# Patient Record
Sex: Female | Born: 1950
Health system: Southern US, Community
[De-identification: ages and names within clinical notes are randomized; demographics above are authoritative.]

## PROBLEM LIST (undated history)

## (undated) DIAGNOSIS — I10 Essential (primary) hypertension: Secondary | ICD-10-CM

## (undated) DIAGNOSIS — J449 Chronic obstructive pulmonary disease, unspecified: Secondary | ICD-10-CM

## (undated) DIAGNOSIS — I739 Peripheral vascular disease, unspecified: Secondary | ICD-10-CM

## (undated) DIAGNOSIS — M1990 Unspecified osteoarthritis, unspecified site: Secondary | ICD-10-CM

## (undated) DIAGNOSIS — E7800 Pure hypercholesterolemia, unspecified: Secondary | ICD-10-CM

## (undated) HISTORY — PX: TUBAL LIGATION: SHX77

---
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
## 2006-05-03 ENCOUNTER — Encounter: Payer: Self-pay | Admitting: Family Medicine

## 2006-05-03 LAB — CONVERTED CEMR LAB
Cholesterol: 170 mg/dL
HDL: 61 mg/dL
Pap Smear: NORMAL

## 2006-06-19 ENCOUNTER — Ambulatory Visit: Payer: Self-pay | Admitting: Internal Medicine

## 2006-09-25 ENCOUNTER — Ambulatory Visit: Payer: Self-pay | Admitting: Family Medicine

## 2006-09-25 DIAGNOSIS — I10 Essential (primary) hypertension: Secondary | ICD-10-CM | POA: Insufficient documentation

## 2006-09-25 DIAGNOSIS — M810 Age-related osteoporosis without current pathological fracture: Secondary | ICD-10-CM | POA: Insufficient documentation

## 2007-01-02 ENCOUNTER — Telehealth: Payer: Self-pay | Admitting: Family Medicine

## 2007-02-07 ENCOUNTER — Ambulatory Visit: Payer: Self-pay | Admitting: Family Medicine

## 2007-02-07 DIAGNOSIS — F17200 Nicotine dependence, unspecified, uncomplicated: Secondary | ICD-10-CM | POA: Insufficient documentation

## 2007-02-10 ENCOUNTER — Encounter: Payer: Self-pay | Admitting: Family Medicine

## 2007-02-20 ENCOUNTER — Telehealth: Payer: Self-pay | Admitting: Family Medicine

## 2007-06-02 ENCOUNTER — Ambulatory Visit: Payer: Self-pay | Admitting: Family Medicine

## 2007-06-18 ENCOUNTER — Telehealth: Payer: Self-pay | Admitting: Family Medicine

## 2007-11-27 ENCOUNTER — Ambulatory Visit: Payer: Self-pay | Admitting: Family Medicine

## 2007-11-27 DIAGNOSIS — M19049 Primary osteoarthritis, unspecified hand: Secondary | ICD-10-CM | POA: Insufficient documentation

## 2008-01-12 ENCOUNTER — Encounter: Payer: Self-pay | Admitting: Family Medicine

## 2008-01-13 LAB — CONVERTED CEMR LAB
ALT: 13 units/L (ref 0–35)
AST: 12 units/L (ref 0–37)
Albumin: 3.9 g/dL (ref 3.5–5.2)
BUN: 9 mg/dL (ref 6–23)
CO2: 26 meq/L (ref 19–32)
Cholesterol: 155 mg/dL (ref 0–200)
Creatinine, Ser: 0.67 mg/dL (ref 0.40–1.20)
Glucose, Bld: 93 mg/dL (ref 70–99)
LDL Cholesterol: 79 mg/dL (ref 0–99)
Sodium: 139 meq/L (ref 135–145)
Total CHOL/HDL Ratio: 2.5
VLDL: 13 mg/dL (ref 0–40)

## 2008-01-27 ENCOUNTER — Ambulatory Visit: Payer: Self-pay | Admitting: Family Medicine

## 2008-03-01 ENCOUNTER — Telehealth: Payer: Self-pay | Admitting: Family Medicine

## 2009-01-10 ENCOUNTER — Encounter: Payer: Self-pay | Admitting: Family Medicine

## 2009-01-10 LAB — HM MAMMOGRAPHY: HM Mammogram: NORMAL

## 2009-01-12 ENCOUNTER — Telehealth: Payer: Self-pay | Admitting: Family Medicine

## 2009-04-06 ENCOUNTER — Telehealth: Payer: Self-pay | Admitting: Family Medicine

## 2010-01-30 ENCOUNTER — Telehealth: Payer: Self-pay | Admitting: Family Medicine

## 2010-01-31 ENCOUNTER — Ambulatory Visit: Payer: Self-pay | Admitting: Family Medicine

## 2010-01-31 DIAGNOSIS — L219 Seborrheic dermatitis, unspecified: Secondary | ICD-10-CM | POA: Insufficient documentation

## 2010-02-08 ENCOUNTER — Telehealth: Payer: Self-pay | Admitting: Family Medicine

## 2010-02-14 ENCOUNTER — Encounter: Payer: Self-pay | Admitting: Family Medicine

## 2010-02-15 LAB — CONVERTED CEMR LAB
Albumin: 3.7 g/dL (ref 3.5–5.2)
Alkaline Phosphatase: 108 units/L (ref 39–117)
BUN: 7 mg/dL (ref 6–23)
Cholesterol: 137 mg/dL (ref 0–200)
Glucose, Bld: 94 mg/dL (ref 70–99)
LDL Cholesterol: 58 mg/dL (ref 0–99)
Potassium: 3.9 meq/L (ref 3.5–5.3)
Total Bilirubin: 0.8 mg/dL (ref 0.3–1.2)

## 2010-05-18 ENCOUNTER — Ambulatory Visit: Payer: Self-pay | Admitting: Family

## 2010-05-18 DIAGNOSIS — R609 Edema, unspecified: Secondary | ICD-10-CM | POA: Insufficient documentation

## 2010-05-19 ENCOUNTER — Encounter: Admission: RE | Admit: 2010-05-19 | Discharge: 2010-05-19 | Payer: Self-pay | Admitting: Family Medicine

## 2010-07-05 ENCOUNTER — Other Ambulatory Visit: Admission: RE | Admit: 2010-07-05 | Discharge: 2010-07-05 | Payer: Self-pay | Admitting: Family Medicine

## 2010-07-05 ENCOUNTER — Ambulatory Visit: Payer: Self-pay | Admitting: Family Medicine

## 2010-07-05 DIAGNOSIS — R0789 Other chest pain: Secondary | ICD-10-CM | POA: Insufficient documentation

## 2010-07-05 LAB — HM PAP SMEAR

## 2010-07-14 ENCOUNTER — Encounter: Payer: Self-pay | Admitting: Family Medicine

## 2010-07-15 LAB — CONVERTED CEMR LAB
ALT: 14 units/L (ref 0–35)
AST: 16 units/L (ref 0–37)
BUN: 6 mg/dL (ref 6–23)
CO2: 28 meq/L (ref 19–32)
Calcium: 9.5 mg/dL (ref 8.4–10.5)
Chloride: 100 meq/L (ref 96–112)
Cholesterol: 188 mg/dL (ref 0–200)
Glucose, Bld: 97 mg/dL (ref 70–99)
HCT: 42.9 % (ref 36.0–46.0)
HDL: 58 mg/dL (ref >39)
Hemoglobin: 13.7 g/dL (ref 12.0–15.0)
LDL Cholesterol: 110 mg/dL — ABNORMAL HIGH (ref 0–99)
Potassium: 4.5 meq/L (ref 3.5–5.3)
RBC: 4.15 M/uL (ref 3.87–5.11)
TSH: 3.416 microintl units/mL (ref 0.350–4.500)
Total Protein: 6.5 g/dL (ref 6.0–8.3)
Vit D, 25-Hydroxy: 22 ng/mL — ABNORMAL LOW (ref 30–89)
WBC: 7.2 10*3/uL (ref 4.0–10.5)

## 2010-07-17 ENCOUNTER — Encounter: Payer: Self-pay | Admitting: Family Medicine

## 2010-12-05 NOTE — Progress Notes (Signed)
Summary: Wants brand name Hyzaar  Phone Note Call from Patient Call back at Work Phone [phone]   Caller: Patient Call For: Brenda Gasser MD Summary of Call: Pt would like the brand name med sent to her pharmacy- tried generic in past and states this is what it does. Please send to [facility] on [address] in [place]- [phone] Initial call taken by: Kathlene November,  February 08, 2010 12:29 PM  Follow-up for Phone Call        OK. SEnt.  Follow-up by: Brenda Gasser MD,  February 08, 2010 12:38 PM    Prescriptions: Mauri Reading 50-12.5 MG TABS (LOSARTAN POTASSIUM-HCTZ) Take 1 tablet by mouth once a day Brand medically necessary #90.0 Each x 1   Entered and Authorized by:   Brenda Gasser MD   Signed by:   Brenda Gasser MD on 02/08/2010   Method used:   Electronically to        [facility]  [phone]* (retail)       [address]       [place], [place]  [address]       Ph: [phone]       Fax: [phone]   RxID:   [id number]

## 2010-12-05 NOTE — Progress Notes (Signed)
Summary: BP med  Phone Note Call from Patient Call back at Work Phone [phone]   Caller: Patient Call For: Nani Gasser MD Summary of Call: Pt calls and took last BP med this morning and her refill was denied because she hasn't been seen since 2009. States is going out of town tomorrow and needs them. Please advise what you want me to do Initial call taken by: Kathlene November,  January 30, 2010 1:00 PM  Follow-up for Phone Call        Has had since December to make appt. Can go to Urgent care if needs a "quick refill"  Follow-up by: Nani Gasser MD,  January 30, 2010 4:08 PM

## 2010-12-05 NOTE — Assessment & Plan Note (Signed)
Summary: HTN, seborrhea   Vital Signs:  Patient profile:   60 year old female Height:      60 inches Weight:      181 pounds BMI:     35.48 Pulse rate:   90 / minute BP sitting:   155 / 79  (left arm) Cuff size:   regular  Vitals Entered By: Kathlene November (January 31, 2010 8:35 AM) CC: followup BP. Pt did not have BP med this morning- is out, Hypertension Management   Primary Care Provider:  Nani Gasser MD  CC:  followup BP. Pt did not have BP med this morning- is out and Hypertension Management.  History of Present Illness: followup BP. Pt did not have BP med this morning- is out.    Has a rash on her forehead that comes and goes every few years.  Usually responds well to tetracycline. Says topical steroids only irritate it. Told by dermatology in the past it is seborrheic dermatitis.   Hypertension History:      She denies headache, chest pain, palpitations, dyspnea with exertion, orthopnea, PND, peripheral edema, visual symptoms, neurologic problems, syncope, and side effects from treatment.  She notes no problems with any antihypertensive medication side effects.        Positive major cardiovascular risk factors include female age 41 years old or older, hypertension, and current tobacco user.     Current Medications (verified): 1)  Hyzaar 50-12.5 Mg Tabs (Losartan Potassium-Hctz) .... Take 1 Tablet By Mouth Once A Day 2)  Multivitamins  Tabs (Multiple Vitamin) .... Take 1 Tablet By Mouth Once A Day 3)  Calcarb 600/d 600-125 Mg-Unit Tabs (Calcium-Vitamin D) .... Take 1 Tablet By Mouth Once A Day 4)  Fish Oil 1000 Mg Caps (Omega-3 Fatty Acids) .... Take 1 Tablet By Mouth Once A Day 5)  Aspirin 81 Mg Tbec (Aspirin) .... Take 1 Tablet By Mouth Once A Day  Allergies (verified): No Known Drug Allergies  Comments:  Nurse/Medical Assistant: The patient's medications and allergies were reviewed with the patient and were updated in the Medication and Allergy Lists. Kathlene November (January 31, 2010 8:36 AM)  Physical Exam  General:  Well-developed,well-nourished,in no acute distress; alert,appropriate and cooperative throughout examination Head:  Normocephalic and atraumatic without obvious abnormalities. No apparent alopecia or balding. Lungs:  Normal respiratory effort, chest expands symmetrically. Lungs are clear to auscultation, no crackles or wheezes. Heart:  Normal rate and regular rhythm. S1 and S2 normal without gallop, murmur, click, rub or other extra  No carotid.   Skin:  Approx 1.5 cm round dry scaling red plaque on her forehead with about 3 pustules.  Psych:  Cognition and judgment appear intact. Alert and cooperative with normal attention span and concentration. No apparent delusions, illusions, hallucinations   Impression & Recommendations:  Problem # 1:  HYPERTENSION, BENIGN ESSENTIAL (ICD-401.1) Elevated today but ran out of her medicine yesterday. Due for labs. Says she will go on Monday.  REstart med and f/u in 6 months. Still is still smoking and not ready to quit.  Her updated medication list for this problem includes:    Hyzaar 50-12.5 Mg Tabs (Losartan potassium-hctz) .Marland Kitchen... Take 1 tablet by mouth once a day  BP today: 155/79 Prior BP: 127/68 (01/27/2008)  Prior 10 Yr Risk Heart Disease: Not enough information (09/25/2006)  Labs Reviewed: K+: 4.5 (01/12/2008) Creat: : 0.67 (01/12/2008)   Chol: 155 (01/12/2008)   HDL: 63 (01/12/2008)   LDL: 79 (01/12/2008)   TG: 65 (  01/12/2008)  Orders: T-Comprehensive Metabolic Panel [phone]) T-Lipid Profile ([id number])  Problem # 2:  DERMATITIS, SEBORRHEIC (ICD-690.10) Typically responds to tetracycline so will rx. If doesn't help them please let me know.   Problem # 3:  OSTEOPOROSIS NOS (ICD-733.00) Says the Boniva would make her sick for about 3 days. Nauseated, no vomiting so she would like to try somehting else. Will change her to alendronate. Call if any SE. Will check Vitamin D  level as well.   The following medications were removed from the medication list:    Boniva 150 Mg Tabs (Ibandronate sodium) ..... One by mouth monthly Her updated medication list for this problem includes:    Calcarb 600/d 600-125 Mg-unit Tabs (Calcium-vitamin d) .Marland Kitchen... Take 1 tablet by mouth once a day    Alendronate Sodium 70 Mg Tabs (Alendronate sodium) ..... One by mouth once a week.  Orders: T-Dual DXA Bone Density/ Axial (29562) T-Vitamin D (25-Hydroxy) ([id number])  Complete Medication List: 1)  Hyzaar 50-12.5 Mg Tabs (Losartan potassium-hctz) .... Take 1 tablet by mouth once a day 2)  Multivitamins Tabs (Multiple vitamin) .... Take 1 tablet by mouth once a day 3)  Calcarb 600/d 600-125 Mg-unit Tabs (Calcium-vitamin d) .... Take 1 tablet by mouth once a day 4)  Fish Oil 1000 Mg Caps (Omega-3 fatty acids) .... Take 1 tablet by mouth once a day 5)  Aspirin 81 Mg Tbec (Aspirin) .... Take 1 tablet by mouth once a day 6)  Tetracycline Hcl 500 Mg Caps (Tetracycline hcl) .... Take 1 tablet by mouth two times a day for 10 days 7)  Alendronate Sodium 70 Mg Tabs (Alendronate sodium) .... One by mouth once a week.  Other Orders: T-Mammography, Diagnostic (bilateral) ([id number])  Hypertension Assessment/Plan:      The patient's hypertensive risk group is category B: At least one risk factor (excluding diabetes) with no target organ damage.  Her calculated 10 year risk of coronary heart disease is 8 %.  Today's blood pressure is 155/79.  Her blood pressure goal is < 140/90.  Patient Instructions: 1)  For blood pressure follow up in 6 months.  2)  For leg swelling please come in sooner.   3)  Can call to schedule your bone density and your mammogram. 4)  We will schedule you for a colonoscopy as well.  Prescriptions: ALENDRONATE SODIUM 70 MG TABS (ALENDRONATE SODIUM) one by mouth once a week.  #4 x 11   Entered and Authorized by:   Nani Gasser MD   Signed by:   Nani Gasser MD on  01/31/2010   Method used:   Electronically to        [facility]* (retail)       [address] [place], [place]  [address]       Ph: [phone]       Fax: [phone]   RxID:   [id number] JOACZY 50-12.5 MG TABS (LOSARTAN POTASSIUM-HCTZ) Take 1 tablet by mouth once a day  #90.0 Each x 1   Entered and Authorized by:   Nani Gasser MD   Signed by:   Nani Gasser MD on 01/31/2010   Method used:   Electronically to        [facility]* (retail)       [address] [place], [place]  [address]       Ph: [phone]       Fax: [phone]  RxID:   [id number] TETRACYCLINE HCL 500 MG CAPS (TETRACYCLINE HCL) Take 1 tablet by mouth two times a day for 10 days  #20 x 0   Entered and Authorized by:   Nani Gasser MD   Signed by:   Nani Gasser MD on 01/31/2010   Method used:   Electronically to        [facility]* (retail)       [address] [place], [place]  [address]       Ph: [phone]       Fax: [phone]   RxID:   [id number]

## 2010-12-05 NOTE — Assessment & Plan Note (Signed)
Summary: Lt. ankle swollen-jr   Vital Signs:  Patient profile:   60 year old female Height:      60 inches Weight:      179 pounds Pulse rate:   76 / minute BP sitting:   146 / 73  (left arm) Cuff size:   regular  Vitals Entered By: Kathlene November (May 18, 2010 3:50 PM) CC: left foot and leg swelling- pain lateral aspect of left knee   Primary Care Provider:  Nani Gasser MD  CC:  left foot and leg swelling- pain lateral aspect of left knee.  History of Present Illness: Brenda Evans is a a 60 year old female who presents today with complaint of left foot swelling.  This has been going on for one month, though she notes that she did have one good week during this month when the swelling was not as severe.   She also notes some pain in her lateral knee.  She notes chronic SOB- though it is a baseline.  Denies fever.  Swelling is improved by elevation, worsened by sitting.  HTN-  notes that her BP at her office this AM was 136/99.  When she left work it was 156/99.    Current Medications (verified): 1)  Hyzaar 50-12.5 Mg Tabs (Losartan Potassium-Hctz) .... Take 1 Tablet By Mouth Once A Day 2)  Multivitamins  Tabs (Multiple Vitamin) .... Take 1 Tablet By Mouth Once A Day 3)  Calcarb 600/d 600-125 Mg-Unit Tabs (Calcium-Vitamin D) .... Take 1 Tablet By Mouth Once A Day 4)  Aspirin 81 Mg Tbec (Aspirin) .... Take 1 Tablet By Mouth Once A Day 5)  Vitamin D 400 Unit Tabs (Cholecalciferol) .... Take One Tablet By Mouth Once A Dya  Allergies (verified): No Known Drug Allergies  Comments:  Nurse/Medical Assistant: The patient's medications and allergies were reviewed with the patient and were updated in the Medication and Allergy Lists. Kathlene November (May 18, 2010 3:51 PM)  Review of Systems       see HPI  Physical Exam  General:  Well-developed,well-nourished,in no acute distress; alert,appropriate and cooperative throughout examination Lungs:  Normal respiratory effort, chest  expands symmetrically. Lungs are clear to auscultation, no crackles or wheezes. Heart:  Normal rate and regular rhythm. S1 and S2 normal without gallop, murmur, click, rub or other extra sounds. Extremities:  + varicose veins.  + spider veins, + swelling of LLE 2-3+ up to knee.   Impression & Recommendations:  Problem # 1:  EDEMA, LIMB (ICD-782.3) Assessment Deteriorated LLE duplex was performed to rule out DVT and was negative.  She is maintained on Hyzaar.  An additional 12.5 of HCTZ was added to her regimen to help with her uncontrolled BP and swelling.  Her updated medication list for this problem includes:    Hyzaar 50-12.5 Mg Tabs (Losartan potassium-hctz) .Marland Kitchen... Take 1 tablet by mouth once a day    Hydrochlorothiazide 12.5 Mg Caps (Hydrochlorothiazide) ..... One tablet by mouth daily  Orders: Ultrasound (Ultrasound)  Problem # 2:  HYPERTENSION, BENIGN ESSENTIAL (ICD-401.1) Assessment: Deteriorated see #1 Her updated medication list for this problem includes:    Hyzaar 50-12.5 Mg Tabs (Losartan potassium-hctz) .Marland Kitchen... Take 1 tablet by mouth once a day    Hydrochlorothiazide 12.5 Mg Caps (Hydrochlorothiazide) ..... One tablet by mouth daily  BP today: 146/73 Prior BP: 155/79 (01/31/2010)  Prior 10 Yr Risk Heart Disease: 8 % (01/31/2010)  Labs Reviewed: K+: 3.9 (02/14/2010) Creat: : 0.71 (02/14/2010)   Chol: 137 (02/14/2010)  HDL: 56 (02/14/2010)   LDL: 58 (02/14/2010)   TG: 114 (02/14/2010)  Complete Medication List: 1)  Hyzaar 50-12.5 Mg Tabs (Losartan potassium-hctz) .... Take 1 tablet by mouth once a day 2)  Multivitamins Tabs (Multiple vitamin) .... Take 1 tablet by mouth once a day 3)  Calcarb 600/d 600-125 Mg-unit Tabs (Calcium-vitamin d) .... Take 1 tablet by mouth once a day 4)  Aspirin 81 Mg Tbec (Aspirin) .... Take 1 tablet by mouth once a day 5)  Vitamin D 400 Unit Tabs (Cholecalciferol) .... Take one tablet by mouth once a dya 6)  Hydrochlorothiazide 12.5 Mg Caps  (Hydrochlorothiazide) .... One tablet by mouth daily  Patient Instructions: 1)  Please complete your ultrasound, we will call you with results. 2)  Please follow up in 1 month. Prescriptions: HYDROCHLOROTHIAZIDE 12.5 MG CAPS (HYDROCHLOROTHIAZIDE) one tablet by mouth daily  #30 x 0   Entered and Authorized by:   Lemont Fillers FNP   Signed by:   Lemont Fillers FNP on 05/18/2010   Method used:   Electronically to        [facility]* (retail)       [address] [place], [place]  [address]       Ph: [phone]       Fax: [phone]   RxID:   [id number] HYZAAR 50-12.5 MG TABS (LOSARTAN POTASSIUM-HCTZ) Take 1 tablet by mouth once a day Brand medically necessary #90.0 Each x 0   Entered and Authorized by:   Lemont Fillers FNP   Signed by:   Lemont Fillers FNP on 05/18/2010   Method used:   Electronically to        [facility]* (retail)       [address] [place], [place]  [address]       Ph: [phone]       Fax: [phone]   RxID:   [id number]

## 2010-12-05 NOTE — Assessment & Plan Note (Signed)
Summary: CPE WITH FASTING LABS   Vital Signs:  Patient profile:   60 year old female Height:      60 inches Weight:      176 pounds Pulse rate:   81 / minute BP sitting:   126 / 74  (right arm) Cuff size:   regular  Vitals Entered By: Kathlene November (July 05, 2010 8:02 AM) CC: CPE and Pap   Primary Care [place]:  Nani Gasser MD  CC:  CPE and Pap.  History of Present Illness: No regular exercise.  Some ankle swelling in the left leg for for about 3 months. Has pain from the knee up.    Last thursday had a chest pain in the center of her chest while sitting at her desk. Lasted about a minute.The a few minutes later got up form her desk and felt like the room was moving or shifting. Lasted about 5 minutes.  Then resolved.  No more chest pain since then.  No sweating or radiating of pain. Occ will feel her heart pound for  a few beats.  Current Medications (verified): 1)  Hyzaar 50-12.5 Mg Tabs (Losartan Potassium-Hctz) .... Take 1 Tablet By Mouth Once A Day 2)  Multivitamins  Tabs (Multiple Vitamin) .... Take 1 Tablet By Mouth Once A Day 3)  Aspirin 81 Mg Tbec (Aspirin) .... Take 1 Tablet By Mouth Once A Day 4)  Vitamin D 400 Unit Tabs (Cholecalciferol) .... Take One Tablet By Mouth Once A Dya  Allergies (verified): No Known Drug Allergies  Comments:  Nurse/Medical Assistant: The patient's medications and allergies were reviewed with the patient and were updated in the Medication and Allergy Lists. 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 Divorced.  ENjoys job.  RAised by grandmother.  Mother died in car accident when 6 months.  Never knew her father.   Current Smoker Alcohol  use-yes Drug use-no Regular exercise-no  Contraindications/Deferment of Procedures/Staging:    Test/Procedure: Colonoscopy    Reason for deferment: patient declined     Test/Procedure: FLU VAX    Reason for deferment: patient declined   Review of Systems       The patient complains of chest pain.  The patient denies anorexia, fever, weight loss, weight gain, vision loss, decreased hearing, hoarseness, syncope, dyspnea on exertion, peripheral edema, prolonged cough, headaches, hemoptysis, abdominal pain, melena, hematochezia, severe indigestion/heartburn, hematuria, incontinence, genital sores, muscle weakness, suspicious skin lesions, transient blindness, difficulty walking, depression, unusual weight change, abnormal bleeding, enlarged lymph nodes, and breast masses.    Physical Exam  General:  Well-developed,well-nourished,in no acute distress; alert,appropriate and cooperative throughout examination Head:  Normocephalic and atraumatic without obvious abnormalities. No apparent alopecia or balding. Eyes:  No corneal or conjunctival inflammation noted. EOMI. Perrla.  Ears:  External ear exam shows no significant lesions or deformities.  Otoscopic examination reveals clear canals, tympanic membranes are intact bilaterally without bulging, retraction, inflammation or discharge. Hearing is grossly normal bilaterally. Nose:  External nasal examination shows no deformity or inflammation.  Mouth:  Oral mucosa and oropharynx without lesions or exudates. dentures.  Neck:  No deformities, masses, or tenderness noted. Chest Wall:  No deformities, masses, or tenderness noted. Breasts:  No mass, nodules, thickening, tenderness, bulging, retraction,  inflamation, nipple discharge or skin changes noted.   Lungs:  Normal respiratory effort, chest expands symmetrically. Lungs are clear to auscultation, no crackles or wheezes. Heart:  Normal rate and regular rhythm. S1 and S2 normal without gallop,  murmur, click, rub or other extra sounds. Abdomen:  Bowel sounds positive,abdomen soft and non-tender without masses, organomegaly or hernias noted. Msk:  No deformity or scoliosis noted of thoracic or lumbar spine.   Pulses:  R and L carotid,radial, dorsalis pedis and posterior tibial pulses are full and equal bilaterally Extremities:  No clubbing, cyanosis, edema, or deformity noted with normal full range of motion of all joints.   Neurologic:  No cranial nerve deficits noted. Station and gait are normal. DTRs are symmetrical throughout. Sensory, motor and coordinative functions appear intact. Skin:  no rashes.   Cervical Nodes:  No lymphadenopathy noted Axillary Nodes:  No palpable lymphadenopathy Psych:  Cognition and judgment appear intact. Alert and cooperative with normal attention span and concentration. No apparent delusions, illusions, hallucinations   Impression & Recommendations:  Problem # 1:  ROUTINE GYNECOLOGICAL EXAMINATION (ICD-V72.31) Declined colonoscopy but OK with doing stool cards Given Tdap. Declined flu shot Due for screening labs.   Given phone number to schedule her mammogram.  Orders: T-Comprehensive Metabolic Panel [phone]) T-Lipid Profile ([id number]) T-CBC No Diff ([id number])  Problem # 2:  CHEST PAIN, ATYPICAL (ICD-786.59)  EKG shows  rate of 74 bpm, NSR, no acute changes. She is a heavy smoker with HTN.  Will refer for stress test.   Orders: EKG w/ Interpretation (93000) T-Thallium ([id number])  Complete Medication List: 1)  Hyzaar 50-12.5 Mg Tabs (Losartan potassium-hctz) .... Take 1 tablet by mouth once a day 2)  Multivitamins Tabs (Multiple vitamin) .... Take 1 tablet by mouth once a day 3)  Aspirin 81 Mg Tbec (Aspirin) .... Take 1 tablet by mouth once a day 4)  Vitamin D 400 Unit Tabs (Cholecalciferol) .... Take one tablet by mouth once a dya  Other Orders: T-Vitamin D (25-Hydroxy) ([id number]) T-TSH [phone]) Tdap => 77yrs IM  (33295) Admin 1st Vaccine (18841)  Patient Instructions: 1)  Return your stool cards when done 2)  We will call you with your fasting lab results 3)  Call [phone] to schedule your mammogram.      Immunizations Administered:  Tetanus Vaccine:    Vaccine Type: Tdap    Site: left deltoid    Mfr: Boostrix    Dose: 0.5 ml    Route: IM    Given by: Sue Lush McCrimmon CMA, (AAMA)    Exp. Date: 08/24/2012    Lot #: [id number]    VIS given: 09/23/07 version given July 05, 2010.

## 2010-12-05 NOTE — Progress Notes (Signed)
Summary: BP med to drying  Phone Note Call from Patient Call back at Work Phone [phone]   Caller: Patient Call For: Nani Gasser MD Summary of Call: Pt calls and states that the BP med you put her on at last OV is drying her out to much and makes her not feel good and wants to know if you will send her the Hyzaar tht she was on into her pharmacy. Initial call taken by: Kathlene November,  February 08, 2010 8:58 AM  Follow-up for Phone Call        I didn't change her medicine. I just refilled what she was already on,  it so I am confused. She is on hyzaar that has the lower dose fluid pill? Uless the pharmacy gave her the wrong pill. What is she actually taking?  Follow-up by: Nani Gasser MD,  February 08, 2010 10:35 AM  Additional Follow-up for Phone Call Additional follow up Details #1::        Pt states the bottle says Losartan/HCTZ 50mg . Says it usually says Hyzaar on it Additional Follow-up by: Kathlene November,  February 08, 2010 11:04 AM    Additional Follow-up for Phone Call Additional follow up Details #2::    It is the except same medicine. It is just the generic which works as well.  Follow-up by: Nani Gasser MD,  February 08, 2010 11:32 AM  Additional Follow-up for Phone Call Additional follow up Details #3:: Details for Additional Follow-up Action Taken: Pt notifeid of above Additional Follow-up by: Kathlene November,  February 08, 2010 11:36 AM

## 2011-02-07 ENCOUNTER — Other Ambulatory Visit: Payer: Self-pay | Admitting: Family Medicine

## 2011-02-17 ENCOUNTER — Encounter: Payer: Self-pay | Admitting: Family Medicine

## 2011-02-19 ENCOUNTER — Other Ambulatory Visit: Payer: Commercial Indemnity

## 2011-02-19 DIAGNOSIS — E538 Deficiency of other specified B group vitamins: Secondary | ICD-10-CM

## 2011-02-19 DIAGNOSIS — I10 Essential (primary) hypertension: Secondary | ICD-10-CM

## 2011-02-20 ENCOUNTER — Encounter: Payer: Self-pay | Admitting: Family Medicine

## 2011-02-20 ENCOUNTER — Telehealth: Payer: Self-pay | Admitting: Family Medicine

## 2011-02-20 ENCOUNTER — Ambulatory Visit (INDEPENDENT_AMBULATORY_CARE_PROVIDER_SITE_OTHER): Payer: Commercial Indemnity | Admitting: Family Medicine

## 2011-02-20 DIAGNOSIS — I10 Essential (primary) hypertension: Secondary | ICD-10-CM

## 2011-02-20 DIAGNOSIS — R002 Palpitations: Secondary | ICD-10-CM

## 2011-02-20 LAB — BASIC METABOLIC PANEL WITH GFR
Chloride: 102 mEq/L (ref 96–112)
GFR, Est African American: >60 mL/min (ref >60)
Glucose, Bld: 94 mg/dL (ref 70–99)
Potassium: 4.4 mEq/L (ref 3.5–5.3)

## 2011-02-20 LAB — VITAMIN B12: Vitamin B-12: 582 pg/mL (ref 211–911)

## 2011-02-20 NOTE — Progress Notes (Signed)
HPI: occ hear "skips a beat" when she lays down at night. Then will feel dizzy when this happens. Lasts a few secons.   Doesn't notice it as much during the daytime.  Started about 4 months.  No change in caffeine.  Drinks multiple caffeinated drinks daily.   No CP.  No SOB.  No family hx of hear arrhythmias.  NO worsening or allevating sxs. Happens almost every night. Still smoking but has cut that in half. Really cut her beer intake down, except for the weekends. Has lost 4lbs.   She is also here to reveiew her labs. Recheck her vit b12, Vit D, CMP and LDL.    Needs prior auth with [employer] for brand only hyzaar. Teh generic version made her feel dizzy and dehydrated.    Objective:  BP 114/72  Pulse 105  Ht 5' 0.5" (1.537 m)  Wt 176 lb (79.833 kg)  BMI 33.81 kg/m2   General no acute distress well-nourished, she does not cigarette smoke.  Head: Normocephalic atraumatic  ENT: Ear canals and TMs are clear bilaterally. Oropharynx is clear. External nose is normal.  Neck: Supple with no lymphadenopathy or thyromegaly  Cardiac: Normal rate and rhythm with no murmurs. No carotid bruits.  Lungs: Clear to auscultation bilaterally with no rhonchi or rales  Neuro: Lily Kocher 2 through 12 intact gait is normal     A/P:  Will schedule for a 48 hour holter monitor for further evaluation. Also consider an echo and possible bilat carotid US.  Thought no bruit on exam. For now work on dec caffeine intake.  Had an EKG performed at her last visit.

## 2011-02-20 NOTE — Telephone Encounter (Signed)
Left message on pts cell

## 2011-02-20 NOTE — Telephone Encounter (Signed)
Call pt: Vitamin D is better than it was but still low. Recommend continue Vit D 2000iu daily. Liver adn kidney function look great. LDL cho is 116 which is better. Goal is under 100 but you are close. Good work. Recheck chol in 1 year.

## 2011-02-20 NOTE — Patient Instructions (Signed)
We will call you when we schedule your heart monitor.

## 2011-02-20 NOTE — Assessment & Plan Note (Signed)
WEll controlled today. Will need to prior auth her hyzaar.

## 2011-02-21 ENCOUNTER — Encounter: Payer: Self-pay | Admitting: Family Medicine

## 2011-03-16 ENCOUNTER — Telehealth: Payer: Self-pay | Admitting: Family Medicine

## 2011-03-16 NOTE — Telephone Encounter (Signed)
Let call and get a prior auth form from the ins[employer] company first.

## 2011-03-16 NOTE — Telephone Encounter (Signed)
Pt called and LMOM for triage nurse stating that she needs to know if we have contacted her [employer] regarding B/P medication Hyzarr.  She is unable to take the generic and the insurance company is requiring a letter stating that and why so pt can get approved to take brand name.  Send the letter to [phone]. Plan:  Routed to Dr Linford Arnold and Avon Gully, CMA Jarvis Newcomer, LPN Domingo Dimes

## 2011-03-19 NOTE — Telephone Encounter (Signed)
Form completed from the [employer] co. Will fax today.

## 2011-03-19 NOTE — Telephone Encounter (Signed)
Pt notified that form was completed and faxed today to her insurance company. Jarvis Newcomer, LPN Domingo Dimes

## 2011-03-23 NOTE — Letter (Signed)
June 19, 2006     Calton Dach, [facility]  [facility]  [address]  [place], [place] [address]   RE:  Brenda Evans, Brenda Evans  MRN:  [id number]  /  DOB:  11-07-1950   Dear Ms. Hedgecock:   Thank you for your kind referral of Ms. Plain regarding colon cancer  screening.  I agree that she is appropriate for a screening colonoscopy.  She will call back to schedule that in the near future.  I also briefly  discussed lifestyle measures for reflux disease as well as weight loss given  her obesity.  My full consultation note should accompany this letter.   Again, I appreciate the opportunity to help care for your patient.    Sincerely,      Iva Boop, MD, [facility]   CEG/MedQ  DD:  06/19/2006  DT:  06/19/2006  Job #:  [id number]

## 2011-03-23 NOTE — Assessment & Plan Note (Signed)
[facility]                     [facility] OFFICE NOTE   Brenda Evans, Brenda Evans                         MRN:          [id number]  DATE:06/19/2006                            DOB:          01-12-1951    REFERRING PHYSICIAN:  Jamal Collin, PA-C.   REASON FOR CONSULTATION:  Cancer screening.   ASSESSMENT:  73. A 60 year old white woman who is average risk for colon cancer      screening and has never had endoscopic screening.  2. Chronic gastroesophageal reflux disease several times a week.  Mainly      controlled by Tums.  Probably related to 10-pound weight gain.  3. Obesity.   RECOMMENDATIONS/PLAN:  1. Schedule screening colonoscopy.  Risks, benefits, and indications have      been explained.  She will call back to schedule this for next month.  A      MiraLax prep is anticipated.  2. Continue to use antacids.  Try to lose weight.  It is recommended that      she try [employer] or [employer] to start.  This was      discussed.  3. She was counseled to quit smoking as well.   HISTORY:  A pleasant 60 year old white woman with problems as outlined  above.  See my medical history form for full details.  There is no  dysphagia, bleeding and there are no worrisome features with respect to  weight loss, etc. regarding her reflux symptoms. There is no family history  of colon cancer.   PAST MEDICAL HISTORY:  1. Osteoporosis.  2. Hypertension.   MEDICATIONS:  1. Hyzaar daily.  2. Boniva once a month.   ALLERGIES:  NO KNOWN DRUG ALLERGIES.   FAMILY HISTORY:  Heart disease in grandmother.   SOCIAL HISTORY:  She is divorced, lives alone.  She smokes a pack per day,  drinks about a six-pack of beer a week.   REVIEW OF SYSTEMS:  All other systems negative.   PHYSICAL EXAMINATION:  GENERAL APPEARANCE:  A pleasant, obese white woman in  no acute distress.  VITAL SIGNS:  Weight 172 pounds, height 5 feet, blood pressure  153/84, pulse  79.  HEENT:  Eyes:  Anicteric.  LUNGS:  Respirations clear.  CARDIOVASCULAR:  S1 and S2 normal without murmurs, rubs, or gallops.  ABDOMEN:  Obese, soft, nontender without organomegaly.  NEUROLOGIC:  She is alert and oriented x3.   I appreciate the opportunity to care for this patient.  I reviewed records  kindly supplied by Ms. Hedgecock, she had a CBC with an MCV of 107 and a  platelet count of 146.  It is certainly possible that her alcohol  consumption is somewhat higher and we will discuss this when she returns.  Her B12 and folate levels were normal.                                   Iva Boop, MD, [facility]   CEG/MedQ  DD:  06/19/2006  DT:  06/19/2006  Job #:  [id number]   cc:   Jamal Collin, PA-C

## 2011-04-10 ENCOUNTER — Other Ambulatory Visit: Payer: Self-pay | Admitting: Family Medicine

## 2011-04-12 ENCOUNTER — Other Ambulatory Visit: Payer: Self-pay | Admitting: Family Medicine

## 2011-04-12 NOTE — Telephone Encounter (Signed)
Pt called and said she is trying to contact to get refill of her hyzarr 12.5 mg QD.  Pt sounded upset.   Plan:  Reviewed chart and  Refill was given on 04-10-11 #30/1 refill.  LMOM for pt to check with her pharm. Jarvis Newcomer, LPN [facility]

## 2011-06-11 ENCOUNTER — Other Ambulatory Visit: Payer: Self-pay | Admitting: Family Medicine

## 2011-12-08 ENCOUNTER — Other Ambulatory Visit: Payer: Self-pay | Admitting: Family Medicine

## 2011-12-10 NOTE — Telephone Encounter (Signed)
Needs appointment

## 2012-03-12 ENCOUNTER — Other Ambulatory Visit: Payer: Self-pay | Admitting: Family Medicine

## 2012-03-24 ENCOUNTER — Telehealth: Payer: Self-pay | Admitting: Family Medicine

## 2012-03-24 NOTE — Telephone Encounter (Signed)
Call pt and let her know neesd appt to f/u HTN.

## 2012-03-24 NOTE — Telephone Encounter (Signed)
Left message on vm

## 2012-04-23 ENCOUNTER — Emergency Department
Admission: EM | Admit: 2012-04-23 | Discharge: 2012-04-23 | Disposition: A | Discharge status: Home / Self Care | Payer: Commercial Indemnity | Source: Home / Self Care | Attending: Emergency Medicine | Admitting: Emergency Medicine

## 2012-04-23 ENCOUNTER — Encounter: Payer: Self-pay | Admitting: Emergency Medicine

## 2012-04-23 DIAGNOSIS — R059 Cough, unspecified: Secondary | ICD-10-CM

## 2012-04-23 DIAGNOSIS — J069 Acute upper respiratory infection, unspecified: Secondary | ICD-10-CM

## 2012-04-23 DIAGNOSIS — R05 Cough: Secondary | ICD-10-CM

## 2012-04-23 HISTORY — DX: Essential (primary) hypertension: I10

## 2012-04-23 MED ORDER — PREDNISONE (PAK) 10 MG PO TABS: 10.0000 mg | ORAL_TABLET | Freq: Every day | ORAL | Status: AC

## 2012-04-23 MED ORDER — GUAIFENESIN-CODEINE 100-10 MG/5ML PO SYRP: 5.0000 mL | ORAL_SOLUTION | Freq: Four times a day (QID) | ORAL | Status: AC | PRN

## 2012-04-23 MED ORDER — AZITHROMYCIN 250 MG PO TABS: ORAL_TABLET | ORAL | Status: AC

## 2012-04-23 NOTE — ED Notes (Addendum)
Cough, wheezing x 4 days

## 2012-04-23 NOTE — ED Provider Notes (Signed)
History     CSN: [id number]  Arrival date & time 04/23/12  4098   First MD Initiated Contact with Patient 04/23/12 [phone]      Chief Complaint  Patient presents with  . Cough    (Consider location/radiation/quality/duration/timing/severity/associated sxs/prior treatment) HPI Brenda Evans is a 61 y.o. female who complains of onset of cold symptoms for 3-4 days.  The symptoms are constant and mild-moderate in severity.  She is a smoker.  She took Benadryl which is helping. No sore throat + cough No pleuritic pain + wheezing No nasal congestion + post-nasal drainage + sinus pain/pressure + chest congestion No itchy/red eyes No earache No hemoptysis No SOB No chills/sweats No fever No nausea No vomiting No abdominal pain No diarrhea No skin rashes No fatigue No myalgias No headache    Past Medical History  Diagnosis Date  . Hypertension     Past Surgical History  Procedure Date  . Tonsillectomy 61 yrs old  . Tubal ligation     Family History  Problem Relation Age of Onset  . Heart attack Other   . Hypertension Other   . Stroke Other     History  Substance Use Topics  . Smoking status: Current Everyday Smoker -- 1.0 packs/day for 50 years  . Smokeless tobacco: Not on file  . Alcohol Use: Yes    OB History    Grav Para Term Preterm Abortions TAB SAB Ect Mult Living                  Review of Systems  All other systems reviewed and are negative.    Allergies  Review of patient's allergies indicates not on file.  Home Medications   Current Outpatient Rx  Name Route Sig Dispense Refill  . ASPIRIN 81 MG PO TABS Oral Take 81 mg by mouth daily.      . AZITHROMYCIN 250 MG PO TABS  Use as directed 1 each 0  . GUAIFENESIN-CODEINE 100-10 MG/5ML PO SYRP Oral Take 5 mLs by mouth 4 (four) times daily as needed for cough or congestion. 120 mL 0  . HYZAAR 50-12.5 MG PO TABS  TAKE 1 TABLET EVERY DAY 30 tablet 0    Pt must make appt before next refill  .  MULTIVITAMIN PO Oral Take 1 tablet by mouth daily.      Marland Kitchen PREDNISONE (PAK) 10 MG PO TABS Oral Take 1 tablet (10 mg total) by mouth daily. 6 day pack, use as directed, Disp 1 pack 21 tablet 0  . VITAMIN D 400 UNITS PO TABS Oral Take 400 Units by mouth daily.        BP 111/74  Pulse 95  Temp 97.7 F (36.5 C) (Oral)  Resp 20  Ht 5' (1.524 m)  Wt 173 lb (78.472 kg)  BMI 33.79 kg/m2  SpO2 96%  Physical Exam  Nursing note and vitals reviewed. Constitutional: She is oriented to person, place, and time. She appears well-developed and well-nourished.       Strong cigarette smoke odor  HENT:  Head: Normocephalic and atraumatic.  Right Ear: Tympanic membrane, external ear and ear canal normal.  Left Ear: Tympanic membrane, external ear and ear canal normal.  Nose: Mucosal edema and rhinorrhea present.  Mouth/Throat: Posterior oropharyngeal erythema present. No oropharyngeal exudate or posterior oropharyngeal edema.  Eyes: No scleral icterus.  Neck: Neck supple.  Cardiovascular: Normal rate, regular rhythm and normal heart sounds.   Pulmonary/Chest: Effort normal. No respiratory distress. She has no  decreased breath sounds. She has wheezes (minimal scattered bilatearl). She has no rhonchi.  Neurological: She is alert and oriented to person, place, and time.  Skin: Skin is warm and dry.  Psychiatric: She has a normal mood and affect. Her speech is normal.    ED Course  Procedures (including critical care time)  Labs Reviewed - No data to display No results found.   1. Cough   2. Acute upper respiratory infections of unspecified site       MDM  1)  Take the prescribed antibiotic as instructed.  Rx for cough syrup and pred pack as well.  If worsening or not improving, consider CXR, however as of today no clinical signs of pneumonia. 2)  Use nasal saline solution (over the counter) at least 3 times a day. 3)  Use over the counter decongestants like Zyrtec-D every 12 hours as needed  to help with congestion.  If you have hypertension, do not take medicines with sudafed.  4)  Can take tylenol every 6 hours or motrin every 8 hours for pain or fever. 5)  Follow up with your primary doctor if no improvement in 5-7 days, sooner if increasing pain, fever, or new symptoms.   I  Marlaine Hind, MD 04/23/12 [phone]

## 2012-05-05 ENCOUNTER — Other Ambulatory Visit: Payer: Self-pay | Admitting: Family Medicine

## 2012-06-11 ENCOUNTER — Ambulatory Visit (INDEPENDENT_AMBULATORY_CARE_PROVIDER_SITE_OTHER): Payer: Commercial Indemnity | Admitting: Family Medicine

## 2012-06-11 ENCOUNTER — Encounter: Payer: Self-pay | Admitting: Family Medicine

## 2012-06-11 VITALS — BP 145/88 | HR 79 | Ht 60.5 in | Wt 176.0 lb

## 2012-06-11 DIAGNOSIS — J309 Allergic rhinitis, unspecified: Secondary | ICD-10-CM

## 2012-06-11 DIAGNOSIS — F17200 Nicotine dependence, unspecified, uncomplicated: Secondary | ICD-10-CM

## 2012-06-11 DIAGNOSIS — Z1231 Encounter for screening mammogram for malignant neoplasm of breast: Secondary | ICD-10-CM

## 2012-06-11 DIAGNOSIS — Z720 Tobacco use: Secondary | ICD-10-CM

## 2012-06-11 DIAGNOSIS — I10 Essential (primary) hypertension: Secondary | ICD-10-CM

## 2012-06-11 MED ORDER — FLUTICASONE PROPIONATE 50 MCG/ACT NA SUSP: 2.0000 | Freq: Every day | NASAL | Status: DC

## 2012-06-11 MED ORDER — HYZAAR 50-12.5 MG PO TABS: 1.0000 | ORAL_TABLET | Freq: Every day | ORAL | Status: DC

## 2012-06-11 NOTE — Patient Instructions (Signed)
Follow up in one month for blood pressure. Bring in your cuff so we can compare. Also go for labwork when able to fast for 8 hours.   Work on low salt diet.   Think about the colonoscopy.

## 2012-06-11 NOTE — Progress Notes (Signed)
  Subjective:    Patient ID: Brenda Evans, female    DOB: 12-09-1950, 61 y.o.   MRN: [id number]  HPI HTN- No CP or SOB. She is taking her meds regularly. She just took her medicine 30 minutes ago. She's not had any problems. She does take bran Hyzaar because generic causes palpitations for her.  Allergic rhinitis-she's been using Claritin but says she feels it's no longer working that well. She has a lot of nasal symptoms with postnasal drip and some cough. She is a smoker. No fever. No chest pain or shortness of breath.   Review of Systems     Objective:   Physical Exam  Constitutional: She is oriented to person, place, and time. She appears well-developed and well-nourished.  HENT:  Head: Normocephalic and atraumatic.  Right Ear: External ear normal.  Left Ear: External ear normal.  Nose: Nose normal.  Mouth/Throat: Oropharynx is clear and moist.       TMs and canals are clear.   Eyes: Conjunctivae and EOM are normal. Pupils are equal, round, and reactive to light.  Neck: Neck supple. No thyromegaly present.  Cardiovascular: Normal rate, regular rhythm and normal heart sounds.   Pulmonary/Chest: Effort normal and breath sounds normal. She has no wheezes.  Lymphadenopathy:    She has no cervical adenopathy.  Neurological: She is alert and oriented to person, place, and time.  Skin: Skin is warm and dry.  Psychiatric: She has a normal mood and affect.          Assessment & Plan:  HTN-uncontrolled today. Asked her to followup in one month and bring her blood pressure cuff with her so we can compare. Her home blood pressures have been primarily running between 120 and 130. And normally she is very well-controlled. She says she just took her medicine about 30 minutes ago. She's due for CMP and fasting lipid panel. Lab slip given today and she will try to get later this week.  Due for screning colonoscopy. She has never had a screening colonoscopy and I encouraged her to really  think about it. She says she will let me know but declines her today.  AR -we discussed changing her Claritin to either of like her to take. She says she doesn't tolerate Allegra very well. I recommend a trial of Zyrtec 10 mg at bedtime because it can cause some sleepiness and sedation. She's also had a lot of excess postnasal drip so we'll start her on Flonase and she cannot take decongestants because of her blood pressure. I explained her that take several days to using the Flonase to see results I encouraged her use it for 1-2 months and then wean off if she is doing well.  Due for screening mammogram. Her last was in 2010. She's okay with making a referral.  Tobacco abuse-reminded her of smoking cessation. She is not ready to quit.

## 2012-12-03 ENCOUNTER — Telehealth: Payer: Self-pay | Admitting: Family Medicine

## 2012-12-03 NOTE — Telephone Encounter (Signed)
Call pt: reminder due for mammo. If ok with scheduling please enter order.

## 2012-12-03 NOTE — Telephone Encounter (Signed)
Called pt and lvm asking her to call back to inform her that it is time for her annual mammogram she can cal [phone] to schedule an appt.Brenda Evans [place]

## 2012-12-21 ENCOUNTER — Other Ambulatory Visit: Payer: Self-pay | Admitting: Family Medicine

## 2013-01-08 ENCOUNTER — Ambulatory Visit (INDEPENDENT_AMBULATORY_CARE_PROVIDER_SITE_OTHER): Payer: Commercial Indemnity | Admitting: Family Medicine

## 2013-01-08 ENCOUNTER — Encounter: Payer: Self-pay | Admitting: Family Medicine

## 2013-01-08 VITALS — BP 115/69 | HR 85 | Ht 60.0 in | Wt 182.0 lb

## 2013-01-08 DIAGNOSIS — R05 Cough: Secondary | ICD-10-CM

## 2013-01-08 DIAGNOSIS — R0982 Postnasal drip: Secondary | ICD-10-CM

## 2013-01-08 DIAGNOSIS — R059 Cough, unspecified: Secondary | ICD-10-CM

## 2013-01-08 DIAGNOSIS — F17200 Nicotine dependence, unspecified, uncomplicated: Secondary | ICD-10-CM

## 2013-01-08 DIAGNOSIS — J309 Allergic rhinitis, unspecified: Secondary | ICD-10-CM

## 2013-01-08 DIAGNOSIS — I10 Essential (primary) hypertension: Secondary | ICD-10-CM

## 2013-01-08 DIAGNOSIS — R053 Chronic cough: Secondary | ICD-10-CM

## 2013-01-08 MED ORDER — HYZAAR 50-12.5 MG PO TABS: 1.0000 | ORAL_TABLET | Freq: Every day | ORAL | Status: DC

## 2013-01-08 NOTE — Progress Notes (Signed)
Subjective:    Patient ID: Brenda Evans, female    DOB: February 12, 1951, 62 y.o.   MRN: [id number]  HPI AR -we discussed changing her Claritin to either of like her to take. She says she doesn't tolerate Allegra very well. I recommend a trial of Zyrtec 10 mg at bedtime because it can cause some sleepiness and sedation. She's also had a lot of excess postnasal drip so we'll start her on Flonase and she cannot take decongestants because of her blood pressure. I explained her that take several days to using the Flonase to see results I encouraged her use it for 1-2 months and then wean off if she is doing well. Wheezes at time. She is still smoking but using some e-cig over the last 2-3 weeks.  No regular exercise.   Right Hip Pain - Wants a form completed for handicap placard. Says she has to park 15 min from work and says really had to get there. Has to stop while walking a coupel fo times to rest.    Review of Systems     BP 115/69  Pulse 85  Ht 5' (1.524 m)  Wt 182 lb (82.555 kg)  BMI 35.54 kg/m2    Allergies  Allergen Reactions  . Allegra (Fexofenadine Hcl) Other (See Comments)    Muscle pain.   Marland Kitchen Fluticasone Other (See Comments)    Nasal sores    Past Medical History  Diagnosis Date  . Hypertension     Past Surgical History  Procedure Laterality Date  . Tonsillectomy  62 yrs old  . Tubal ligation      History   Social History  . Marital Status: Divorced    Spouse Name: N/A    Number of Children: N/A  . Years of Education: N/A   Occupational History  . Not on file.   Social History Main Topics  . Smoking status: Current Every Day Smoker -- 1.00 packs/day for 50 years  . Smokeless tobacco: Not on file  . Alcohol Use: Yes  . Drug Use: No  . Sexually Active:    Other Topics Concern  . Not on file   Social History Narrative  . No narrative on file    Family History  Problem Relation Age of Onset  . Heart attack Other   . Hypertension Other   . Stroke Other      Outpatient Encounter Prescriptions as of 01/08/2013  Medication Sig Dispense Refill  . aspirin 81 MG tablet Take 81 mg by mouth daily.        Marland Kitchen HYZAAR 50-12.5 MG per tablet Take 1 tablet by mouth daily.  90 tablet  1  . [DISCONTINUED] fluticasone (FLONASE) 50 MCG/ACT nasal spray Place 2 sprays into the nose daily.  16 g  2  . [DISCONTINUED] HYZAAR 50-12.5 MG per tablet TAKE 1 TABLET BY MOUTH DAILY.  30 tablet  0  . [DISCONTINUED] HYZAAR 50-12.5 MG per tablet Take 1 tablet by mouth daily.  30 tablet  3  . [DISCONTINUED] loratadine (CLARITIN) 10 MG tablet Take 10 mg by mouth daily.      . vitamin D, CHOLECALCIFEROL, 400 UNITS tablet Take 400 Units by mouth daily.         No facility-administered encounter medications on file as of 01/08/2013.       Objective:   Physical Exam  Constitutional: She is oriented to person, place, and time. She appears well-developed and well-nourished.  HENT:  Head: Normocephalic and atraumatic.  Right  Ear: External ear normal.  Left Ear: External ear normal.  Nose: Nose normal.  Mouth/Throat: Oropharynx is clear and moist.  TMs and canals are clear.   Eyes: Conjunctivae and EOM are normal. Pupils are equal, round, and reactive to light.  Neck: Neck supple. No thyromegaly present.  Cardiovascular: Normal rate, regular rhythm and normal heart sounds.   Pulmonary/Chest: Effort normal and breath sounds normal. She has no wheezes.  Lymphadenopathy:    She has no cervical adenopathy.  Neurological: She is alert and oriented to person, place, and time.  Skin: Skin is warm and dry.  Psychiatric: She has a normal mood and affect. Her behavior is normal.          Assessment & Plan:  AR  - Try zyrtec 2-3 week, since the Claritin was not helpful and says she doesn't tolerate Allegra. At that point; and her postnasal drip and suggested she discontinue it. I explained her that the most likely cause of postnasal drip is allergic rhinitis but if we are treating  allergies and he does not get better then that may not be the primary cause. May just be chronic post nasal drip.  HTN - Well controlled. Continje current regimen. F/u  In 6 months.   tob abuse - I strongly suspect that she has COPD. She has a chronic wet cough. I will do a chest x-ray today just to make sure that there is no sign infection or tumor. She is trying to wean down by using E. cigarettes. I encouraged her to do so. This would make a big difference as far as her chronic cough is concerned. I asked her to schedule a followup visit for spirometry as that we can better evaluate whether not she has COPD and better stage her. I truly think this is the explanation for her chronic cough. It sounds more deep and wet and is not typical of an ACE inhibitor or ARB cough.  Right hip pain-I be happy that handicap placard for her but I want her to continue to get as much exercise as possible.  Chronic post nasal drip. - I explained her that the most likely cause of postnasal drip is allergic rhinitis but if we are treating allergies and he does not get better then that may not be the primary cause. May just be chronic post nasal drip.

## 2013-01-13 ENCOUNTER — Other Ambulatory Visit: Payer: Self-pay | Admitting: Family Medicine

## 2013-01-14 ENCOUNTER — Ambulatory Visit (INDEPENDENT_AMBULATORY_CARE_PROVIDER_SITE_OTHER): Payer: Commercial Indemnity

## 2013-01-14 DIAGNOSIS — F17200 Nicotine dependence, unspecified, uncomplicated: Secondary | ICD-10-CM

## 2013-01-14 DIAGNOSIS — R053 Chronic cough: Secondary | ICD-10-CM

## 2013-01-14 DIAGNOSIS — R059 Cough, unspecified: Secondary | ICD-10-CM

## 2013-01-14 DIAGNOSIS — R05 Cough: Secondary | ICD-10-CM

## 2013-01-14 DIAGNOSIS — R062 Wheezing: Secondary | ICD-10-CM

## 2013-01-16 ENCOUNTER — Ambulatory Visit (INDEPENDENT_AMBULATORY_CARE_PROVIDER_SITE_OTHER): Payer: Commercial Indemnity | Admitting: Family Medicine

## 2013-01-16 ENCOUNTER — Encounter: Payer: Self-pay | Admitting: Family Medicine

## 2013-01-16 VITALS — BP 118/67 | HR 78 | Ht 60.0 in | Wt 182.0 lb

## 2013-01-16 DIAGNOSIS — R053 Chronic cough: Secondary | ICD-10-CM

## 2013-01-16 DIAGNOSIS — F17200 Nicotine dependence, unspecified, uncomplicated: Secondary | ICD-10-CM

## 2013-01-16 DIAGNOSIS — J449 Chronic obstructive pulmonary disease, unspecified: Secondary | ICD-10-CM | POA: Insufficient documentation

## 2013-01-16 DIAGNOSIS — R05 Cough: Secondary | ICD-10-CM

## 2013-01-16 DIAGNOSIS — R059 Cough, unspecified: Secondary | ICD-10-CM

## 2013-01-16 LAB — COMPLETE METABOLIC PANEL WITH GFR
Albumin: 4.1 g/dL (ref 3.5–5.2)
Alkaline Phosphatase: 84 U/L (ref 39–117)
Calcium: 9.1 mg/dL (ref 8.4–10.5)
GFR, Est African American: >89 mL/min
Potassium: 4.6 mEq/L (ref 3.5–5.3)
Total Bilirubin: 0.6 mg/dL (ref 0.3–1.2)
Total Protein: 6.8 g/dL (ref 6.0–8.3)

## 2013-01-16 LAB — LIPID PANEL
Cholesterol: 171 mg/dL (ref 0–200)
HDL: 55 mg/dL (ref >39)
LDL Cholesterol: 99 mg/dL (ref 0–99)
Total CHOL/HDL Ratio: 3.1 Ratio

## 2013-01-16 MED ORDER — TIOTROPIUM BROMIDE MONOHYDRATE 18 MCG IN CAPS: 18.0000 ug | ORAL_CAPSULE | Freq: Every day | RESPIRATORY_TRACT | Status: DC

## 2013-01-16 MED ORDER — ALBUTEROL SULFATE (2.5 MG/3ML) 0.083% IN NEBU
2.5000 mg | INHALATION_SOLUTION | Freq: Once | RESPIRATORY_TRACT | Status: AC
  Administered 2013-01-16: 2.5 mg via RESPIRATORY_TRACT

## 2013-01-16 MED ORDER — ALBUTEROL SULFATE HFA 108 (90 BASE) MCG/ACT IN AERS: 2.0000 | INHALATION_SPRAY | Freq: Four times a day (QID) | RESPIRATORY_TRACT | Status: DC | PRN

## 2013-01-16 NOTE — Progress Notes (Signed)
  Subjective:    Patient ID: Brenda Evans, female    DOB: June 22, 1951, 63 y.o.   MRN: [id number]  HPI Hear for spirometry. She has a history of a chronic wet cough. She is also a pack-a-day smoker for almost 50 years. I strong suspect COPD and asked her to come in to have further testing today. She has noticed that she has modified some of her activities over time. She does get a little more short of breath with long walks. She's not using any inhalers and has never been prescribed these. She does have a history of some allergies.   Review of Systems     Objective:   Physical Exam  Constitutional: She is oriented to person, place, and time. She appears well-developed and well-nourished.  HENT:  Head: Normocephalic and atraumatic.  Eyes: Conjunctivae and EOM are normal.  Cardiovascular: Normal rate.   Pulmonary/Chest: Effort normal.  Neurological: She is alert and oriented to person, place, and time.  Skin: Skin is dry. No pallor.  Psychiatric: She has a normal mood and affect. Her behavior is normal.          Assessment & Plan:  COPD - Spirometry on 01/16/2013 shows an FEV1 ratio 68%, FEC of 71% FEV1 of 58%. She did have some mild improvement with albuterol. COPD is moderate. I reviewed these results with her. Discussed that she does need a rescue inhaler and when to use it. Prescription sent to pharmacy. We also discussed that she needs to be on a long-term medication such as Spiriva. She was given a sample and a straightahead he said device. Prescription sent to her pharmacy. Followup in one month to make sure that she's using it appropriately and not having any side effects with the medication. Strongly encourage smoking cessation. She is working on it and has started using subcutaneous cigarettes. We discussed this at her last office visit. Again recommend making sure that her vaccines are up to date including pneumonia and yearly flu shots.

## 2013-01-16 NOTE — Patient Instructions (Signed)
Smoking Cessation Quitting smoking is important to your health and has many advantages. However, it is not always easy to quit since nicotine is a very addictive drug. Often times, people try 3 times or more before being able to quit. This document explains the best ways for you to prepare to quit smoking. Quitting takes hard work and a lot of effort, but you can do it. ADVANTAGES OF QUITTING SMOKING  You will live longer, feel better, and live better.  Your body will feel the impact of quitting smoking almost immediately.  Within 20 minutes, blood pressure decreases. Your pulse returns to its normal level.  After 8 hours, carbon monoxide levels in the blood return to normal. Your oxygen level increases.  After 24 hours, the chance of having a heart attack starts to decrease. Your breath, hair, and body stop smelling like smoke.  After 48 hours, damaged nerve endings begin to recover. Your sense of taste and smell improve.  After 72 hours, the body is virtually free of nicotine. Your bronchial tubes relax and breathing becomes easier.  After 2 to 12 weeks, lungs can hold more air. Exercise becomes easier and circulation improves.  The risk of having a heart attack, stroke, cancer, or lung disease is greatly reduced.  After 1 year, the risk of coronary heart disease is cut in half.  After 5 years, the risk of stroke falls to the same as a nonsmoker.  After 10 years, the risk of lung cancer is cut in half and the risk of other cancers decreases significantly.  After 15 years, the risk of coronary heart disease drops, usually to the level of a nonsmoker.  If you are pregnant, quitting smoking will improve your chances of having a healthy baby.  The people you live with, especially any children, will be healthier.  You will have extra money to spend on things other than cigarettes. QUESTIONS TO THINK ABOUT BEFORE ATTEMPTING TO QUIT You may want to talk about your answers with your  caregiver.  Why do you want to quit?  If you tried to quit in the past, what helped and what did not?  What will be the most difficult situations for you after you quit? How will you plan to handle them?  Who can help you through the tough times? Your family? Friends? A caregiver?  What pleasures do you get from smoking? What ways can you still get pleasure if you quit? Here are some questions to ask your caregiver:  How can you help me to be successful at quitting?  What medicine do you think would be best for me and how should I take it?  What should I do if I need more help?  What is smoking withdrawal like? How can I get information on withdrawal? GET READY  Set a quit date.  Change your environment by getting rid of all cigarettes, ashtrays, matches, and lighters in your home, car, or work. Do not let people smoke in your home.  Review your past attempts to quit. Think about what worked and what did not. GET SUPPORT AND ENCOURAGEMENT You have a better chance of being successful if you have help. You can get support in many ways.  Tell your family, friends, and co-workers that you are going to quit and need their support. Ask them not to smoke around you.  Get individual, group, or telephone counseling and support. Programs are available at local hospitals and health centers. Call your local health department for   information about programs in your area.  Spiritual beliefs and practices may help some smokers quit.  Download a "quit meter" on your computer to keep track of quit statistics, such as how long you have gone without smoking, cigarettes not smoked, and money saved.  Get a self-help book about quitting smoking and staying off of tobacco. LEARN NEW SKILLS AND BEHAVIORS  Distract yourself from urges to smoke. Talk to someone, go for a walk, or occupy your time with a task.  Change your normal routine. Take a different route to work. Drink tea instead of coffee.  Eat breakfast in a different place.  Reduce your stress. Take a hot bath, exercise, or read a book.  Plan something enjoyable to do every day. Reward yourself for not smoking.  Explore interactive web-based programs that specialize in helping you quit. GET MEDICINE AND USE IT CORRECTLY Medicines can help you stop smoking and decrease the urge to smoke. Combining medicine with the above behavioral methods and support can greatly increase your chances of successfully quitting smoking.  Nicotine replacement therapy helps deliver nicotine to your body without the negative effects and risks of smoking. Nicotine replacement therapy includes nicotine gum, lozenges, inhalers, nasal sprays, and skin patches. Some may be available over-the-counter and others require a prescription.  Antidepressant medicine helps people abstain from smoking, but how this works is unknown. This medicine is available by prescription.  Nicotinic receptor partial agonist medicine simulates the effect of nicotine in your brain. This medicine is available by prescription. Ask your caregiver for advice about which medicines to use and how to use them based on your health history. Your caregiver will tell you what side effects to look out for if you choose to be on a medicine or therapy. Carefully read the information on the package. Do not use any other product containing nicotine while using a nicotine replacement product.  RELAPSE OR DIFFICULT SITUATIONS Most relapses occur within the first 3 months after quitting. Do not be discouraged if you start smoking again. Remember, most people try several times before finally quitting. You may have symptoms of withdrawal because your body is used to nicotine. You may crave cigarettes, be irritable, feel very hungry, cough often, get headaches, or have difficulty concentrating. The withdrawal symptoms are only temporary. They are strongest when you first quit, but they will go away within  10 14 days. To reduce the chances of relapse, try to:  Avoid drinking alcohol. Drinking lowers your chances of successfully quitting.  Reduce the amount of caffeine you consume. Once you quit smoking, the amount of caffeine in your body increases and can give you symptoms, such as a rapid heartbeat, sweating, and anxiety.  Avoid smokers because they can make you want to smoke.  Do not let weight gain distract you. Many smokers will gain weight when they quit, usually less than 10 pounds. Eat a healthy diet and stay active. You can always lose the weight gained after you quit.  Find ways to improve your mood other than smoking. FOR MORE INFORMATION  www.smokefree.gov  Document Released: 10/16/2001 Document Revised: 04/22/2012 Document Reviewed: 01/31/2012 [employer] Patient Information 2013 [employer].  

## 2013-01-16 NOTE — Progress Notes (Signed)
Quick Note:  All labs are normal. ______ 

## 2013-01-30 ENCOUNTER — Encounter: Payer: Self-pay | Admitting: Family Medicine

## 2013-05-18 ENCOUNTER — Ambulatory Visit (INDEPENDENT_AMBULATORY_CARE_PROVIDER_SITE_OTHER): Payer: BC Managed Care – PPO | Admitting: Family Medicine

## 2013-05-18 ENCOUNTER — Encounter: Payer: Self-pay | Admitting: Family Medicine

## 2013-05-18 VITALS — BP 120/74 | HR 91 | Wt 168.0 lb

## 2013-05-18 DIAGNOSIS — R197 Diarrhea, unspecified: Secondary | ICD-10-CM

## 2013-05-18 NOTE — Progress Notes (Signed)
CC: Brenda Evans is a 62 y.o. female is here for Diarrhea   Subjective: HPI:  Patient complains of 10 days of brown liquid bowel movements that have occurred 1-2 times a day. Symptoms have not gotten better or worsens onset. They do seem to be somewhat controlled with a single dose of Imodium every other day. No other interventions. They do not wake her from her sleep she has not had fecal incontinence. She's never had this before she has absolutely no abdominal or pelvic or back pain. She denies recent change in medication, diet or physical activity. Bowel movements occur anytime of the day they are without blood or tar appearance. She denies fevers, chills, nausea, decreased appetite, genitourinary complaints, skin changes, headache, rapid heartbeat, chest pain, nor shortness of breath. She does endorse fatigue for the hours after a bowel movement. She's never had a colonoscopy. She denies recent travel, recent antibiotic use, nor drinking unprocessed water or ingestion of raw food   Review Of Systems Outlined In HPI  Past Medical History  Diagnosis Date  . Hypertension      Family History  Problem Relation Age of Onset  . Heart attack Other   . Hypertension Other   . Stroke Other      History  Substance Use Topics  . Smoking status: Current Every Day Smoker -- 1.00 packs/day for 50 years  . Smokeless tobacco: Not on file  . Alcohol Use: Yes     Objective: Filed Vitals:   05/18/13 1125  BP: 120/74  Pulse: 91    General: Alert and Oriented, No Acute Distress HEENT: Pupils equal, round, reactive to light. Conjunctivae clear.  Moist mucous membranes pharynx unremarkable Lungs: Clear to auscultation bilaterally, no wheezing/ronchi/rales.  Comfortable work of breathing. Good air movement. Cardiac: Regular rate and rhythm. Normal S1/S2.  No murmurs, rubs, nor gallops.   Abdomen: Normal bowel sounds, soft and non tender without palpable masses. Extremities: No peripheral edema.   Strong peripheral pulses.  Mental Status: No depression, anxiety, nor agitation. Skin: Warm and dry.  Assessment & Plan: Brenda Evans was seen today for diarrhea.  Diagnoses and associated orders for this visit:  Diarrhea - CBC with Differential - COMPLETE METABOLIC PANEL WITH GFR - Stool culture    I encouraged her to take Imodium every 12 hours and focus on a bland diet staying well-hydrated pending the above labs.Signs and symptoms requring emergent/urgent reevaluation were discussed with the patient. Discussed with patient lack of red flags to suggest serious infectious or inflammatory pathology at this time.  Return if symptoms worsen or fail to improve.

## 2013-05-19 LAB — COMPLETE METABOLIC PANEL WITH GFR
Alkaline Phosphatase: 102 U/L (ref 39–117)
BUN: 12 mg/dL (ref 6–23)
Creat: 0.84 mg/dL (ref 0.50–1.10)
Glucose, Bld: 86 mg/dL (ref 70–99)
Potassium: 3.6 mEq/L (ref 3.5–5.3)
Sodium: 135 mEq/L (ref 135–145)
Total Protein: 6.9 g/dL (ref 6.0–8.3)

## 2013-05-19 LAB — CBC WITH DIFFERENTIAL/PLATELET
Basophils Absolute: 0 10*3/uL (ref 0.0–0.1)
Basophils Relative: 0 % (ref 0–1)
HCT: 40 % (ref 36.0–46.0)
Hemoglobin: 13.4 g/dL (ref 12.0–15.0)
MCHC: 33.5 g/dL (ref 30.0–36.0)
MCV: 94.6 fL (ref 78.0–100.0)
Monocytes Absolute: 0.6 10*3/uL (ref 0.1–1.0)
Monocytes Relative: 8 % (ref 3–12)
Neutro Abs: 5.2 10*3/uL (ref 1.7–7.7)
Platelets: 204 10*3/uL (ref 150–400)
RBC: 4.23 MIL/uL (ref 3.87–5.11)
RDW: 14.3 % (ref 11.5–15.5)
WBC: 8 10*3/uL (ref 4.0–10.5)

## 2013-10-20 ENCOUNTER — Other Ambulatory Visit: Payer: Self-pay | Admitting: Family Medicine

## 2013-11-16 ENCOUNTER — Other Ambulatory Visit: Payer: Self-pay | Admitting: Family Medicine

## 2013-12-17 ENCOUNTER — Other Ambulatory Visit: Payer: Self-pay | Admitting: Family Medicine

## 2014-01-21 ENCOUNTER — Other Ambulatory Visit: Payer: Self-pay | Admitting: Family Medicine

## 2014-01-27 ENCOUNTER — Other Ambulatory Visit: Payer: Self-pay | Admitting: Family Medicine

## 2014-02-01 ENCOUNTER — Other Ambulatory Visit: Payer: Self-pay | Admitting: *Deleted

## 2014-02-01 MED ORDER — HYZAAR 50-12.5 MG PO TABS: ORAL_TABLET | ORAL | Status: DC

## 2014-02-03 ENCOUNTER — Ambulatory Visit: Payer: Self-pay | Admitting: Family Medicine

## 2014-02-10 ENCOUNTER — Ambulatory Visit: Payer: Self-pay | Admitting: Family Medicine

## 2014-02-25 ENCOUNTER — Telehealth: Payer: Self-pay | Admitting: *Deleted

## 2014-02-25 ENCOUNTER — Encounter: Payer: Self-pay | Admitting: Family Medicine

## 2014-02-25 ENCOUNTER — Ambulatory Visit (INDEPENDENT_AMBULATORY_CARE_PROVIDER_SITE_OTHER): Payer: BC Managed Care – PPO | Admitting: Family Medicine

## 2014-02-25 VITALS — BP 138/86 | HR 78 | Wt 175.0 lb

## 2014-02-25 DIAGNOSIS — F17200 Nicotine dependence, unspecified, uncomplicated: Secondary | ICD-10-CM

## 2014-02-25 DIAGNOSIS — E538 Deficiency of other specified B group vitamins: Secondary | ICD-10-CM

## 2014-02-25 DIAGNOSIS — M810 Age-related osteoporosis without current pathological fracture: Secondary | ICD-10-CM

## 2014-02-25 DIAGNOSIS — J449 Chronic obstructive pulmonary disease, unspecified: Secondary | ICD-10-CM

## 2014-02-25 DIAGNOSIS — I10 Essential (primary) hypertension: Secondary | ICD-10-CM

## 2014-02-25 DIAGNOSIS — Z1231 Encounter for screening mammogram for malignant neoplasm of breast: Secondary | ICD-10-CM

## 2014-02-25 MED ORDER — TIOTROPIUM BROMIDE MONOHYDRATE 18 MCG IN CAPS: ORAL_CAPSULE | RESPIRATORY_TRACT | Status: DC

## 2014-02-25 MED ORDER — ALBUTEROL SULFATE HFA 108 (90 BASE) MCG/ACT IN AERS: 2.0000 | INHALATION_SPRAY | Freq: Four times a day (QID) | RESPIRATORY_TRACT | Status: DC | PRN

## 2014-02-25 MED ORDER — HYZAAR 50-12.5 MG PO TABS: ORAL_TABLET | ORAL | Status: DC

## 2014-02-25 NOTE — Telephone Encounter (Signed)
Form faxed and scanned.Brenda Evans

## 2014-02-25 NOTE — Progress Notes (Signed)
   Subjective:    Patient ID: Brenda Evans, female    DOB: 1951-03-16, 63 y.o.   MRN: [id number]  HPI Hypertension- Pt denies chest pain, SOB, dizziness, or heart palpitations.  Taking meds as directed w/o problems.  Denies medication side effects.  Needs to be on brand and nees appeal form completed.    COPD - doing well. No recent flares.  Happy with the spiriva.  Does need refills on her medication.   Tob abuse - still smoking 1 ppd. Was using e-cig but has quit.  She knows she need to cut back.    Review of Systems     Objective:   Physical Exam  Constitutional: She is oriented to person, place, and time. She appears well-developed and well-nourished.  HENT:  Head: Normocephalic and atraumatic.  Cardiovascular: Normal rate, regular rhythm and normal heart sounds.   Pulmonary/Chest: Effort normal and breath sounds normal.  Neurological: She is alert and oriented to person, place, and time.  Skin: Skin is warm and dry.  Psychiatric: She has a normal mood and affect. Her behavior is normal.          Assessment & Plan:  HTN- well controlled. Continue  Current regime. F/U in 6 mo.   COPD- well controlled on spiriva.  She is doing well. Still smoking.    Colon canc screening- discussed need for screening colonoscopy. She declined is willing to do stool cards today. Kit provided. Likely she will bring these back.  Tobacco abuse-encourage cessation. Encouraged her to at least consider cutting back on her current regimen or trying the e-cigarettes again.  Will place order for mammogram.   She declined shingles vaccine today but will think about it. Encouraged her to check with her insurance for coverage.  Also encouraged to schedule physical with Pap smear as her last Pap smear was 4 years ago.

## 2014-05-03 ENCOUNTER — Other Ambulatory Visit: Payer: Self-pay | Admitting: Family Medicine

## 2014-06-28 ENCOUNTER — Other Ambulatory Visit: Payer: Self-pay | Admitting: Family Medicine

## 2014-07-16 IMAGING — CR DG CHEST 2V
2 series · 2 of 2 positions shown · non-contrast
Comparison: None.

CLINICAL DATA: Chronic cough, wheezing for 6 months, smoking
history

CHEST - 2 VIEW

[view not recorded (1 of 2)]
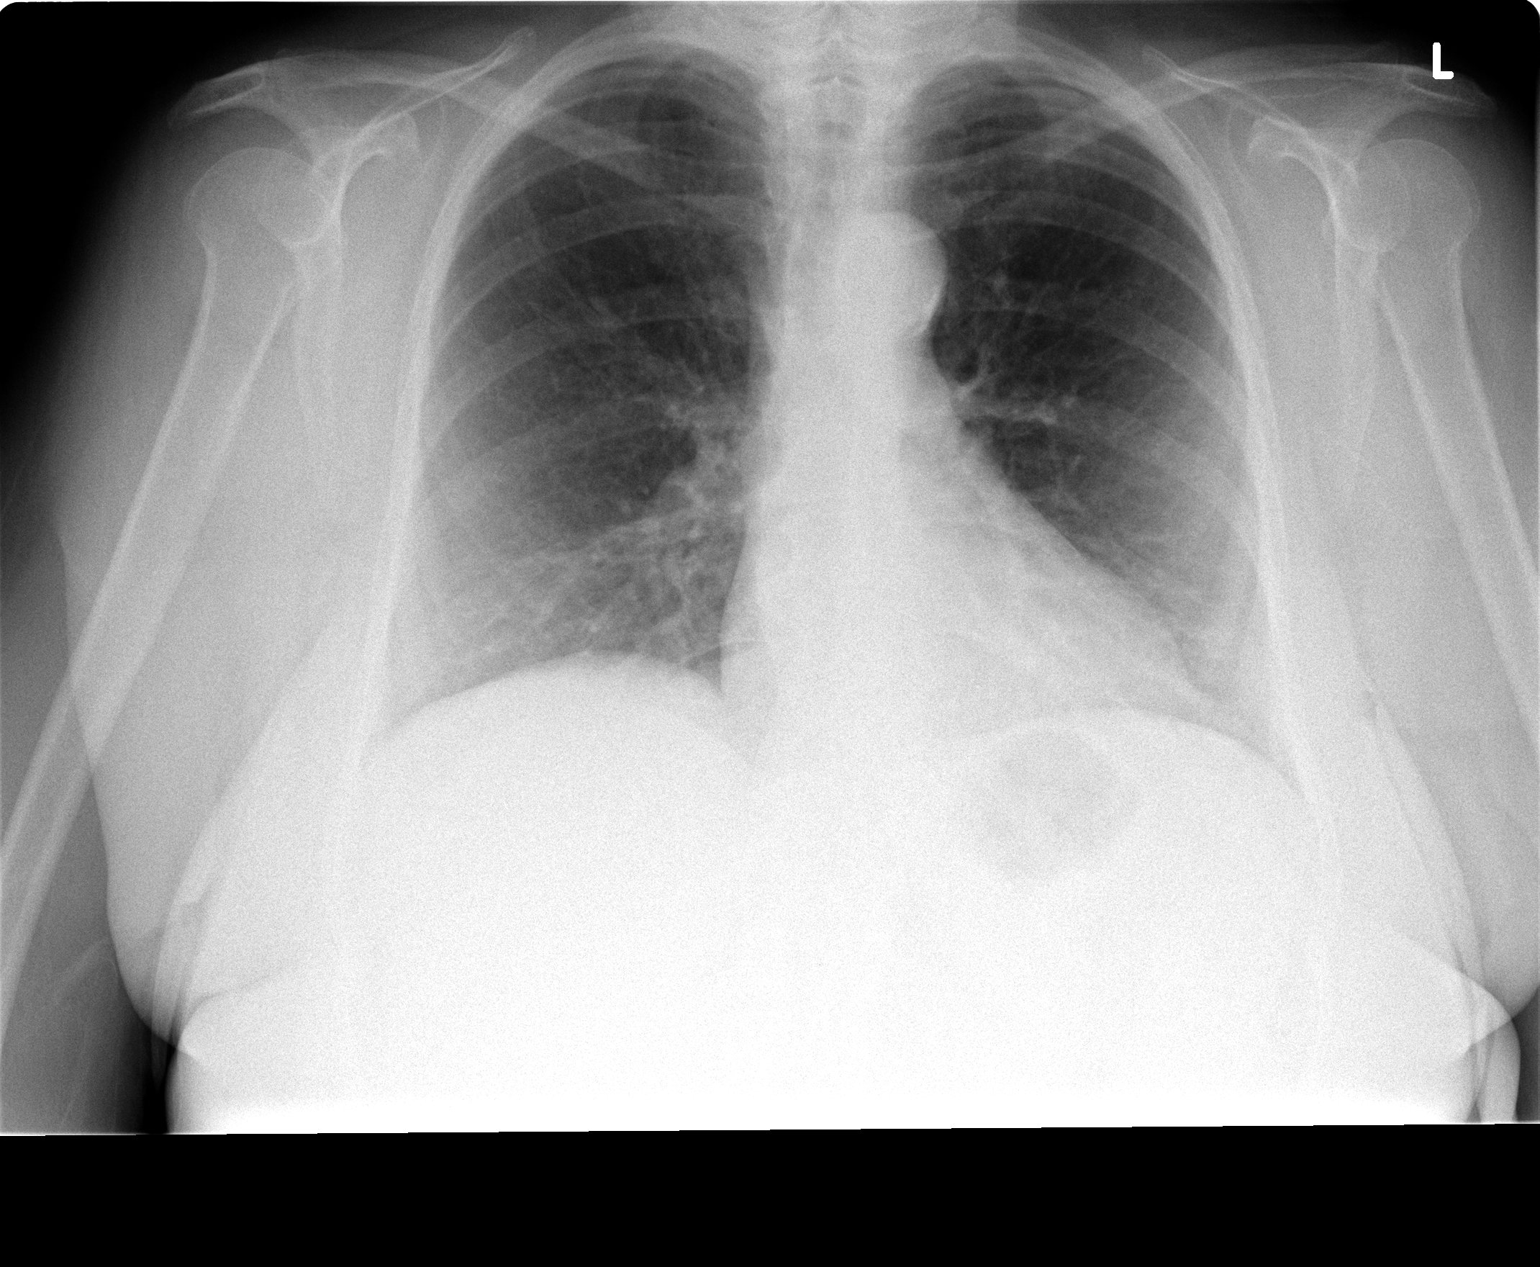

[view not recorded (2 of 2)]
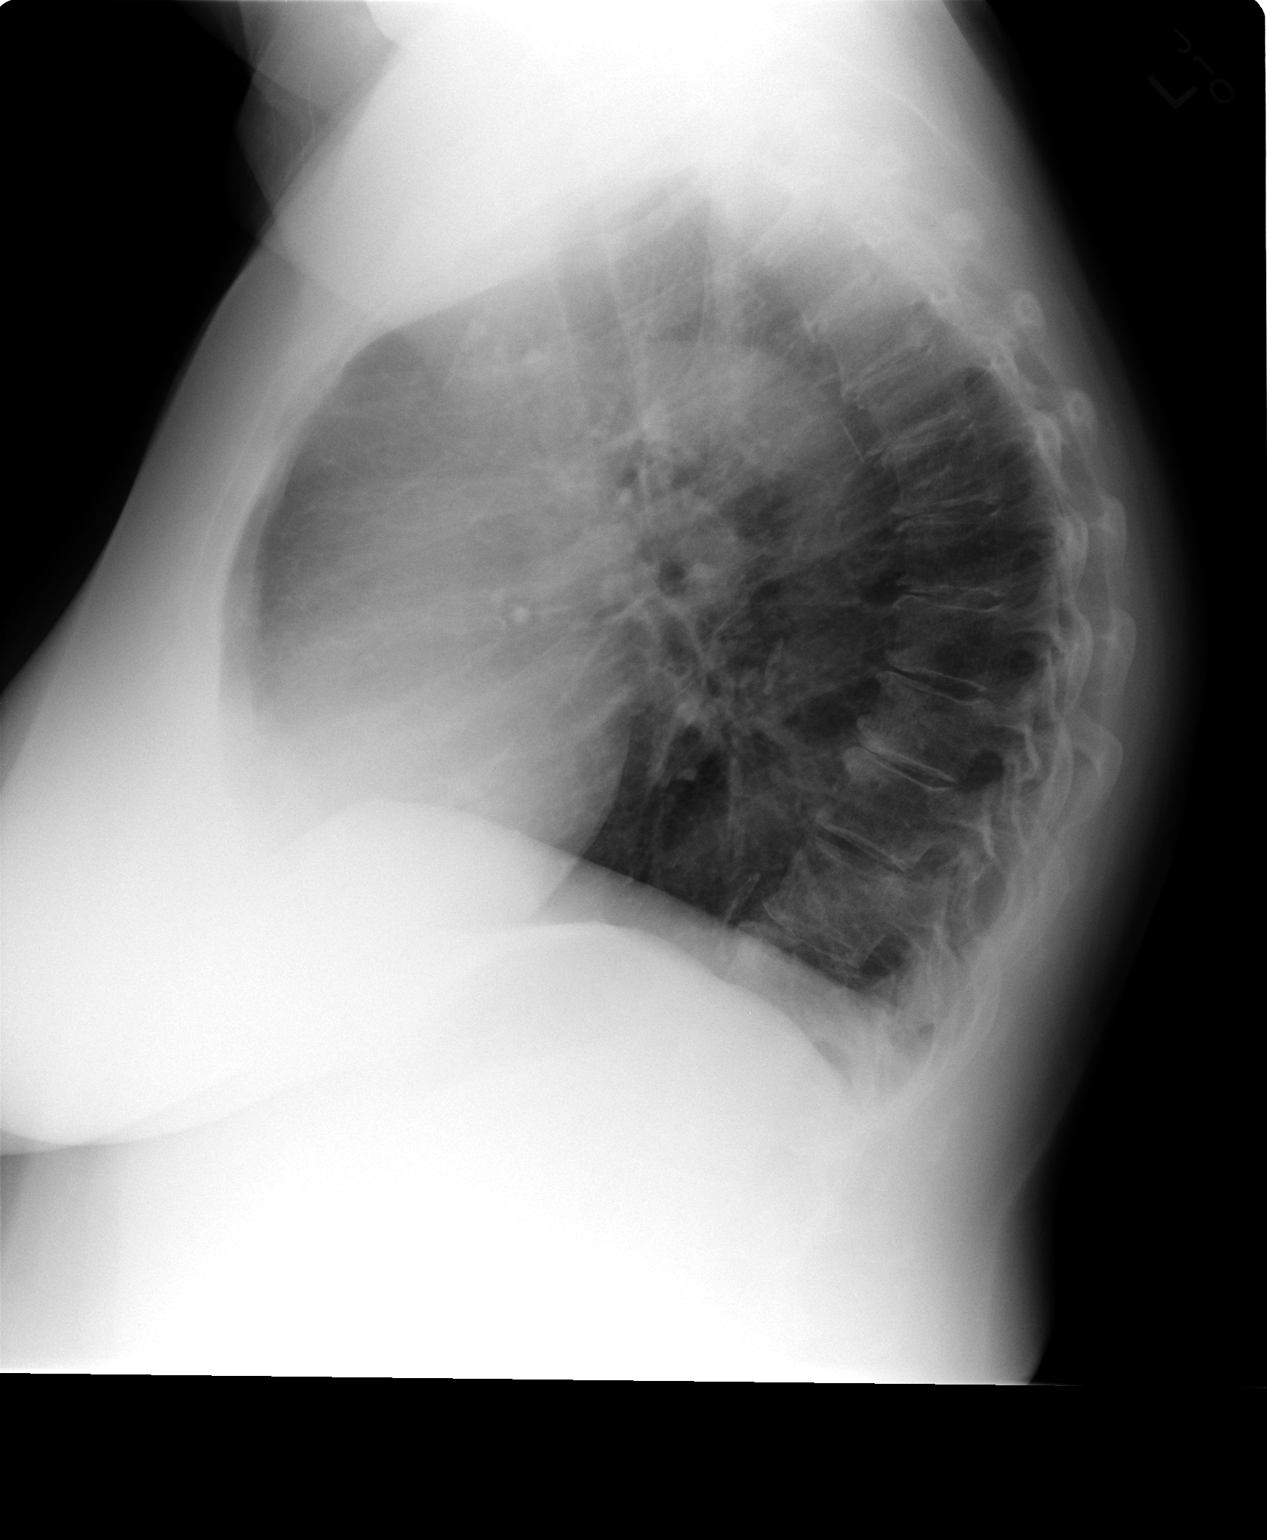

[2 of 2 positions shown; findings below may reference images not displayed]

FINDINGS: The lungs are clear but hyperaerated with some flattening
of hemidiaphragms suggesting emphysematous change.  No infiltrate
or effusion is seen.  Mediastinal contours appear normal.  The
heart is within upper limits of normal.  There are degenerative
changes throughout the thoracic spine.
IMPRESSION: No active lung disease. Hyperaeration, possible mild emphysema.

## 2014-07-29 ENCOUNTER — Other Ambulatory Visit: Payer: Self-pay | Admitting: Family Medicine

## 2014-08-30 ENCOUNTER — Other Ambulatory Visit: Payer: Self-pay | Admitting: Family Medicine

## 2014-09-28 ENCOUNTER — Other Ambulatory Visit: Payer: Self-pay | Admitting: Family Medicine

## 2014-10-27 ENCOUNTER — Other Ambulatory Visit: Payer: Self-pay | Admitting: Family Medicine

## 2014-10-30 ENCOUNTER — Other Ambulatory Visit: Payer: Self-pay | Admitting: Family Medicine

## 2014-11-24 ENCOUNTER — Ambulatory Visit: Payer: Self-pay | Admitting: Family Medicine

## 2014-12-01 ENCOUNTER — Encounter: Payer: Self-pay | Admitting: Family Medicine

## 2014-12-01 ENCOUNTER — Ambulatory Visit (INDEPENDENT_AMBULATORY_CARE_PROVIDER_SITE_OTHER): Payer: BLUE CROSS/BLUE SHIELD | Admitting: Family Medicine

## 2014-12-01 VITALS — BP 114/68 | HR 93 | Ht 60.0 in | Wt 173.0 lb

## 2014-12-01 DIAGNOSIS — I10 Essential (primary) hypertension: Secondary | ICD-10-CM

## 2014-12-01 DIAGNOSIS — E538 Deficiency of other specified B group vitamins: Secondary | ICD-10-CM | POA: Diagnosis not present

## 2014-12-01 DIAGNOSIS — M810 Age-related osteoporosis without current pathological fracture: Secondary | ICD-10-CM | POA: Diagnosis not present

## 2014-12-01 DIAGNOSIS — J449 Chronic obstructive pulmonary disease, unspecified: Secondary | ICD-10-CM | POA: Diagnosis not present

## 2014-12-01 DIAGNOSIS — G4700 Insomnia, unspecified: Secondary | ICD-10-CM

## 2014-12-01 MED ORDER — ALPRAZOLAM 0.5 MG PO TABS: 0.2500 mg | ORAL_TABLET | Freq: Every evening | ORAL | Status: DC | PRN

## 2014-12-01 MED ORDER — ALBUTEROL SULFATE HFA 108 (90 BASE) MCG/ACT IN AERS: 2.0000 | INHALATION_SPRAY | Freq: Four times a day (QID) | RESPIRATORY_TRACT | Status: DC | PRN

## 2014-12-01 MED ORDER — TIOTROPIUM BROMIDE MONOHYDRATE 18 MCG IN CAPS: ORAL_CAPSULE | RESPIRATORY_TRACT | Status: DC

## 2014-12-01 MED ORDER — OLMESARTAN MEDOXOMIL-HCTZ 20-12.5 MG PO TABS: 1.0000 | ORAL_TABLET | Freq: Every day | ORAL | Status: DC

## 2014-12-01 NOTE — Progress Notes (Signed)
   Subjective:    Patient ID: Brenda Evans, female    DOB: [birth year], 64 y.o.   MRN: [id number]  HPI Hypertension- Pt denies chest pain, SOB, dizziness, or heart palpitations.  Taking meds as directed w/o problems.  Denies medication side effects.  Currently on Hyzaar.    COPD- currently on Spiriva. Doing well on the medication.  Uses alubterol rarely, less than once a month. Still smoking.    Insomnia - would like to have xanax to help her sleep. Says only would need it a few times a months.  She hs taken it before. She says has tried sleep aids in the past and didn't like how they made her feel.   Review of Systems     Objective:   Physical Exam  Constitutional: She is oriented to person, place, and time. She appears well-developed and well-nourished.  HENT:  Head: Normocephalic and atraumatic.  Cardiovascular: Normal rate, regular rhythm and normal heart sounds.   Pulmonary/Chest: Effort normal and breath sounds normal.  Neurological: She is alert and oriented to person, place, and time.  Skin: Skin is warm and dry.  Psychiatric: She has a normal mood and affect. Her behavior is normal.          Assessment & Plan:  HTN -   Well controlled. Continue current regimen.  COPD - Will schedule for spirometry in the spring.  She continues to smoke. This is the importance of smoking cessation.  Insomnia - discussed that in today's pains can be habit-forming and to use very sparingly. She is most having difficulty with sleep initiation. I will go ahead and give her 10 tabs today. She is intolerant to several other sleep aids.  Osteoprosis - will check Vitamin D. He did not go for her lab work last spring but says she's ready to go now.

## 2014-12-03 ENCOUNTER — Telehealth: Payer: Self-pay | Admitting: *Deleted

## 2014-12-03 MED ORDER — HYZAAR 50-12.5 MG PO TABS: 1.0000 | ORAL_TABLET | Freq: Every day | ORAL | Status: DC

## 2014-12-03 NOTE — Telephone Encounter (Signed)
Pt lvm stating that her ins will not pay for the new bp med that Dr. Linford ArnoldMetheney switched her to she is asking that Dr. Linford ArnoldMetheney [facility].Loralee PacasBarkley, Brenda Bither [place]

## 2014-12-03 NOTE — Telephone Encounter (Signed)
Ok Hyzaar sent. Did she try the coupon card for the Benicar?

## 2014-12-07 NOTE — Telephone Encounter (Signed)
Up to her if wants ot retry with the coupon.

## 2014-12-07 NOTE — Telephone Encounter (Signed)
Pt stated she did not get coupon for benicar. [name].[name], [name]

## 2015-05-04 LAB — COMPLETE METABOLIC PANEL WITH GFR
ALT: 9 U/L (ref 0–35)
AST: 12 U/L (ref 0–37)
Albumin: 3.9 g/dL (ref 3.5–5.2)
Alkaline Phosphatase: 82 U/L (ref 39–117)
BILIRUBIN TOTAL: 0.6 mg/dL (ref 0.2–1.2)
BUN: 13 mg/dL (ref 6–23)
CO2: 29 meq/L (ref 19–32)
CREATININE: 0.74 mg/dL (ref 0.50–1.10)
Calcium: 9.4 mg/dL (ref 8.4–10.5)
Chloride: 101 mEq/L (ref 96–112)
GFR, EST NON AFRICAN AMERICAN: 87 mL/min
GFR, Est African American: >89 mL/min
Glucose, Bld: 91 mg/dL (ref 70–99)
Potassium: 4.8 mEq/L (ref 3.5–5.3)
Sodium: 138 mEq/L (ref 135–145)
Total Protein: 6.6 g/dL (ref 6.0–8.3)

## 2015-05-04 LAB — LIPID PANEL
CHOLESTEROL: 196 mg/dL (ref 0–200)
HDL: 53 mg/dL (ref >=46)
LDL CALC: 123 mg/dL — AB (ref 0–99)
Total CHOL/HDL Ratio: 3.7 Ratio
Triglycerides: 98 mg/dL (ref <150)
VLDL: 20 mg/dL (ref 0–40)

## 2015-05-04 LAB — VITAMIN B12: Vitamin B-12: 874 pg/mL (ref 211–911)

## 2015-05-04 LAB — VITAMIN D 25 HYDROXY (VIT D DEFICIENCY, FRACTURES): VIT D 25 HYDROXY: 31 ng/mL (ref 30–100)

## 2015-06-09 ENCOUNTER — Telehealth: Payer: Self-pay | Admitting: Family Medicine

## 2015-06-09 ENCOUNTER — Other Ambulatory Visit: Payer: Self-pay | Admitting: Family Medicine

## 2015-06-09 NOTE — Telephone Encounter (Signed)
Received fax for prior authorization on Hyzaar sent through cover my meds waiting on authorization. - CF

## 2015-06-14 NOTE — Telephone Encounter (Signed)
Prior authorization was denied for Hyzaar 50/0.5 milligrams tab. They will cover all valsartan/ HCT.  Please call the patient and see if she would be willing to try this instead. It does have a generic available.

## 2015-06-21 MED ORDER — VALSARTAN-HYDROCHLOROTHIAZIDE 160-12.5 MG PO TABS: 1.0000 | ORAL_TABLET | Freq: Every day | ORAL | Status: DC

## 2015-06-21 NOTE — Telephone Encounter (Signed)
New Rx sent.

## 2015-06-21 NOTE — Telephone Encounter (Signed)
Pt is very ok with trying the valsartan generic.  [facility] is her pharmacy.

## 2015-06-24 ENCOUNTER — Ambulatory Visit: Payer: BLUE CROSS/BLUE SHIELD | Admitting: Family Medicine

## 2015-09-18 ENCOUNTER — Other Ambulatory Visit: Payer: Self-pay | Admitting: Family Medicine

## 2015-10-24 ENCOUNTER — Other Ambulatory Visit: Payer: Self-pay | Admitting: Family Medicine

## 2015-11-01 ENCOUNTER — Ambulatory Visit (INDEPENDENT_AMBULATORY_CARE_PROVIDER_SITE_OTHER): Payer: BLUE CROSS/BLUE SHIELD | Admitting: Family Medicine

## 2015-11-01 ENCOUNTER — Encounter: Payer: Self-pay | Admitting: Family Medicine

## 2015-11-01 VITALS — BP 138/54 | HR 75 | Wt 168.0 lb

## 2015-11-01 DIAGNOSIS — I10 Essential (primary) hypertension: Secondary | ICD-10-CM

## 2015-11-01 DIAGNOSIS — Z1211 Encounter for screening for malignant neoplasm of colon: Secondary | ICD-10-CM

## 2015-11-01 DIAGNOSIS — J449 Chronic obstructive pulmonary disease, unspecified: Secondary | ICD-10-CM | POA: Diagnosis not present

## 2015-11-01 DIAGNOSIS — F17200 Nicotine dependence, unspecified, uncomplicated: Secondary | ICD-10-CM | POA: Diagnosis not present

## 2015-11-01 MED ORDER — TIOTROPIUM BROMIDE MONOHYDRATE 18 MCG IN CAPS: ORAL_CAPSULE | RESPIRATORY_TRACT | Status: DC

## 2015-11-01 MED ORDER — VALSARTAN-HYDROCHLOROTHIAZIDE 160-12.5 MG PO TABS: 1.0000 | ORAL_TABLET | Freq: Every day | ORAL | Status: DC

## 2015-11-01 NOTE — Progress Notes (Signed)
   Subjective:    Patient ID: Brenda AlbrightGloria Evans, female    DOB: 23-Apr-1951, 64 y.o.   MRN: [id number]  HPI Hypertension- Pt denies chest pain, SOB, dizziness, or heart palpitations.  Taking meds as directed w/o problems.  Denies medication side effects.  She ran out of her medication so off of it right now.    COPD, moderate with tobacco abuse.  No recent flares.  occ gets some phelgm in AM. Occ SOB with activities.    Tobacco abuse - she hasn't cut back.  No interested in quitting.    Review of Systems     Objective:   Physical Exam  Constitutional: She is oriented to person, place, and time. She appears well-developed and well-nourished.  HENT:  Head: Normocephalic and atraumatic.  Cardiovascular: Normal rate, regular rhythm and normal heart sounds.   Pulmonary/Chest: Effort normal and breath sounds normal.  Neurological: She is alert and oriented to person, place, and time.  Skin: Skin is warm and dry.  Psychiatric: She has a normal mood and affect. Her behavior is normal.          Assessment & Plan:  HTN - blood pressure is up a little bit today. We'll send over prescription to the pharmacy and encouraged her to get back on it as soon as possible. She ran out. Also encouraged her to come in on her regularly scheduled appointment so this doesn't happen. Due for BMP.  COPD-stable. No recent flares or exacerbations. Continue Spiriva daily with as needed albuterol. Follow-up in 6 months.  Tobacco abuse-encourage cessation and even considering cutting back. She doesn't seem engaged to actually attempt quitting at this point.  She declines mammogram. She said it and if it came back abnormal she would do anything different.  We did discuss colon cancer screening and she is okay with doing the Cologuard. We'll have her complete the form and get faxed today.

## 2016-01-20 ENCOUNTER — Telehealth: Payer: Self-pay | Admitting: Family Medicine

## 2016-01-20 NOTE — Telephone Encounter (Signed)
Called Pt to see if she was going to complete her Cologuard. Pt states she is not going to do it, would not give a specific reason. Will inform PCP.

## 2016-04-11 ENCOUNTER — Encounter: Payer: Self-pay | Admitting: Family Medicine

## 2016-04-11 ENCOUNTER — Ambulatory Visit (INDEPENDENT_AMBULATORY_CARE_PROVIDER_SITE_OTHER): Payer: BLUE CROSS/BLUE SHIELD | Admitting: Family Medicine

## 2016-04-11 VITALS — BP 107/51 | HR 88 | Wt 166.0 lb

## 2016-04-11 DIAGNOSIS — I10 Essential (primary) hypertension: Secondary | ICD-10-CM | POA: Diagnosis not present

## 2016-04-11 DIAGNOSIS — M79669 Pain in unspecified lower leg: Secondary | ICD-10-CM | POA: Diagnosis not present

## 2016-04-11 DIAGNOSIS — J449 Chronic obstructive pulmonary disease, unspecified: Secondary | ICD-10-CM | POA: Diagnosis not present

## 2016-04-11 MED ORDER — TIOTROPIUM BROMIDE MONOHYDRATE 2.5 MCG/ACT IN AERS: 2.0000 | INHALATION_SPRAY | Freq: Every day | RESPIRATORY_TRACT | Status: DC

## 2016-04-11 NOTE — Progress Notes (Signed)
Subjective:    CC: HTN  HPI: Hypertension- Pt denies chest pain, SOB, dizziness, or heart palpitations.  Taking meds as directed w/o problems.  Denies medication side effects.    COPD - She is doing well overall. She said she's had a good spring and really hasn't had any exacerbations. She is interested in switching the Spiriva from the pill form to the inhaler form. She said she saw an ad for it and is interested in doing that. She denies any increase in cough or sputum production.  Bilateral leg pain x 1 months.  Saw her podiatrist and was put in a boot for about 2 months.  She says she had a torn tendon in her right medial ankle area.  Left foot goes to sleep when standing. Says she was told to get [employer] tennis shoes and says every since then has been having pain over her upper shins in both legs.  Says worse by the end of the day.  . + smoker.   Past medical history, Surgical history, Family history not pertinant except as noted below, Social history, Allergies, and medications have been entered into the medical record, reviewed, and corrections made.   Review of Systems: No fevers, chills, night sweats, weight loss, chest pain, or shortness of breath.   Objective:    General: Well Developed, well nourished, and in no acute distress.  Neuro: Alert and oriented x3, extra-ocular muscles intact, sensation grossly intact.  HEENT: Normocephalic, atraumatic  Skin: Warm and dry, no rashes. Cardiac: Regular rate and rhythm, no murmurs rubs or gallops, no lower extremity edema.  Respiratory: Clear to auscultation bilaterally. Not using accessory muscles, speaking in full sentences. MSK: Trace edema over the shins bilaterally and ankles. Mildly tender in these areas as well. She does have some very large varicose veins. No tenderness with calf squeeze. Knees and ankles with normal range of motion.   Impression and Recommendations:   HTN- Well controlled. Continue current regimen. Follow up in 6  mo.   COPD - We will change her Spiriva to the respimat. Coupon card provided. Follow-up in 6 months.  Bilateral lower leg pain-possible shin splints. But unclear etiology. No sign of blood clot or DVT. No pain or tenderness in the calves. Certainly could be from the shoes that are correcting the position which could be causing some extra strain on the upper part of the leg. She plans on following up podiatry. If podiatry feels like it's unrelated then consider further workup with ABIs to look for vascular compromise and she is a long-term heavy smoker and consider EMG study for nerve conduction since she feels like she is getting some numbness in the left foot as well.  She declines mammogram screening today.  Did encourage her to schedule last exam with Pap smear.

## 2016-04-11 NOTE — Patient Instructions (Signed)
Please schedule your well woman exam with Pap smear sometime this summer.

## 2016-04-12 ENCOUNTER — Other Ambulatory Visit: Payer: Self-pay | Admitting: *Deleted

## 2016-04-12 DIAGNOSIS — R7989 Other specified abnormal findings of blood chemistry: Secondary | ICD-10-CM

## 2016-04-12 LAB — BASIC METABOLIC PANEL WITH GFR
BUN: 12 mg/dL (ref 7–25)
CALCIUM: 8.3 mg/dL — AB (ref 8.6–10.4)
CO2: 29 mmol/L (ref 20–31)
CREATININE: 0.77 mg/dL (ref 0.50–0.99)
Chloride: 98 mmol/L (ref 98–110)
GFR, Est African American: >89 mL/min (ref >=60)
GFR, Est Non African American: 82 mL/min (ref >=60)
Glucose, Bld: 80 mg/dL (ref 65–99)
Potassium: 4 mmol/L (ref 3.5–5.3)
SODIUM: 132 mmol/L — AB (ref 135–146)

## 2016-04-30 ENCOUNTER — Other Ambulatory Visit: Payer: Self-pay | Admitting: *Deleted

## 2016-04-30 DIAGNOSIS — R7989 Other specified abnormal findings of blood chemistry: Secondary | ICD-10-CM

## 2016-04-30 LAB — BASIC METABOLIC PANEL WITH GFR
BUN: 9 mg/dL (ref 7–25)
CALCIUM: 8.9 mg/dL (ref 8.6–10.4)
CHLORIDE: 98 mmol/L (ref 98–110)
CO2: 26 mmol/L (ref 20–31)
Creat: 0.6 mg/dL (ref 0.50–0.99)
GFR, Est African American: >89 mL/min (ref >=60)
GFR, Est Non African American: >89 mL/min (ref >=60)
Glucose, Bld: 91 mg/dL (ref 65–99)
Potassium: 4.1 mmol/L (ref 3.5–5.3)
SODIUM: 133 mmol/L — AB (ref 135–146)

## 2016-05-01 ENCOUNTER — Other Ambulatory Visit: Payer: Self-pay | Admitting: Family Medicine

## 2016-05-01 MED ORDER — VALSARTAN 160 MG PO TABS: 160.0000 mg | ORAL_TABLET | Freq: Every day | ORAL | Status: DC

## 2016-07-01 ENCOUNTER — Other Ambulatory Visit: Payer: Self-pay | Admitting: Family Medicine

## 2016-07-23 ENCOUNTER — Encounter: Payer: BLUE CROSS/BLUE SHIELD | Admitting: Family Medicine

## 2016-08-06 ENCOUNTER — Encounter: Payer: BLUE CROSS/BLUE SHIELD | Admitting: Family Medicine

## 2016-08-17 ENCOUNTER — Encounter: Payer: Self-pay | Admitting: Family Medicine

## 2016-08-17 ENCOUNTER — Other Ambulatory Visit: Payer: Self-pay | Admitting: Family Medicine

## 2016-08-17 DIAGNOSIS — Z1321 Encounter for screening for nutritional disorder: Secondary | ICD-10-CM

## 2016-08-17 DIAGNOSIS — I10 Essential (primary) hypertension: Secondary | ICD-10-CM

## 2016-08-17 DIAGNOSIS — Z0000 Encounter for general adult medical examination without abnormal findings: Secondary | ICD-10-CM

## 2016-08-17 DIAGNOSIS — Z13220 Encounter for screening for lipoid disorders: Secondary | ICD-10-CM

## 2016-08-17 DIAGNOSIS — Z1329 Encounter for screening for other suspected endocrine disorder: Secondary | ICD-10-CM

## 2016-08-20 ENCOUNTER — Encounter: Payer: BLUE CROSS/BLUE SHIELD | Admitting: Family Medicine

## 2016-08-24 LAB — CBC WITH DIFFERENTIAL/PLATELET
BASOS ABS: 0 {cells}/uL (ref 0–200)
Basophils Relative: 0 %
EOS ABS: 237 {cells}/uL (ref 15–500)
Eosinophils Relative: 3 %
HCT: 39.4 % (ref 35.0–45.0)
HEMOGLOBIN: 12.7 g/dL (ref 11.7–15.5)
LYMPHS ABS: 2370 {cells}/uL (ref 850–3900)
Lymphocytes Relative: 30 %
MCH: 31.3 pg (ref 27.0–33.0)
MCHC: 32.2 g/dL (ref 32.0–36.0)
MCV: 97 fL (ref 80.0–100.0)
MPV: 11.9 fL (ref 7.5–12.5)
Monocytes Absolute: 474 cells/uL (ref 200–950)
Monocytes Relative: 6 %
NEUTROS ABS: 4819 {cells}/uL (ref 1500–7800)
Neutrophils Relative %: 61 %
Platelets: 156 10*3/uL (ref 140–400)
RBC: 4.06 MIL/uL (ref 3.80–5.10)
RDW: 13.9 % (ref 11.0–15.0)
WBC: 7.9 10*3/uL (ref 3.8–10.8)

## 2016-08-25 LAB — COMPLETE METABOLIC PANEL WITH GFR
ALBUMIN: 3.9 g/dL (ref 3.6–5.1)
ALK PHOS: 74 U/L (ref 33–130)
ALT: 10 U/L (ref 6–29)
AST: 12 U/L (ref 10–35)
BILIRUBIN TOTAL: 0.5 mg/dL (ref 0.2–1.2)
BUN: 12 mg/dL (ref 7–25)
CO2: 28 mmol/L (ref 20–31)
CREATININE: 0.79 mg/dL (ref 0.50–0.99)
Calcium: 9.3 mg/dL (ref 8.6–10.4)
Chloride: 102 mmol/L (ref 98–110)
GFR, EST NON AFRICAN AMERICAN: 80 mL/min (ref >=60)
GFR, Est African American: >89 mL/min (ref >=60)
GLUCOSE: 89 mg/dL (ref 65–99)
Potassium: 4.7 mmol/L (ref 3.5–5.3)
SODIUM: 136 mmol/L (ref 135–146)
TOTAL PROTEIN: 6.5 g/dL (ref 6.1–8.1)

## 2016-08-25 LAB — LIPID PANEL
CHOLESTEROL: 183 mg/dL (ref 125–200)
HDL: 59 mg/dL (ref >=46)
LDL Cholesterol: 106 mg/dL (ref <130)
TRIGLYCERIDES: 91 mg/dL (ref <150)
Total CHOL/HDL Ratio: 3.1 Ratio (ref <=5.0)
VLDL: 18 mg/dL (ref <30)

## 2016-08-25 LAB — VITAMIN B12: Vitamin B-12: 448 pg/mL (ref 200–1100)

## 2016-08-25 LAB — TSH: TSH: 2.57 m[IU]/L

## 2016-08-25 LAB — VITAMIN D 25 HYDROXY (VIT D DEFICIENCY, FRACTURES): Vit D, 25-Hydroxy: 35 ng/mL (ref 30–100)

## 2016-08-28 ENCOUNTER — Encounter: Payer: Self-pay | Admitting: Family Medicine

## 2016-08-28 ENCOUNTER — Ambulatory Visit (INDEPENDENT_AMBULATORY_CARE_PROVIDER_SITE_OTHER): Payer: BLUE CROSS/BLUE SHIELD | Admitting: Family Medicine

## 2016-08-28 ENCOUNTER — Other Ambulatory Visit (HOSPITAL_COMMUNITY)
Admission: RE | Admit: 2016-08-28 | Discharge: 2016-08-28 | Disposition: A | Discharge status: Home / Self Care | Payer: BLUE CROSS/BLUE SHIELD | Source: Ambulatory Visit | Attending: Family Medicine | Admitting: Family Medicine

## 2016-08-28 VITALS — BP 131/54 | HR 86 | Ht 60.0 in | Wt 159.0 lb

## 2016-08-28 DIAGNOSIS — Z01419 Encounter for gynecological examination (general) (routine) without abnormal findings: Secondary | ICD-10-CM | POA: Insufficient documentation

## 2016-08-28 DIAGNOSIS — I10 Essential (primary) hypertension: Secondary | ICD-10-CM | POA: Diagnosis not present

## 2016-08-28 DIAGNOSIS — Z0000 Encounter for general adult medical examination without abnormal findings: Secondary | ICD-10-CM

## 2016-08-28 DIAGNOSIS — Z1151 Encounter for screening for human papillomavirus (HPV): Secondary | ICD-10-CM | POA: Insufficient documentation

## 2016-08-28 DIAGNOSIS — Z1231 Encounter for screening mammogram for malignant neoplasm of breast: Secondary | ICD-10-CM

## 2016-08-28 MED ORDER — LIDOCAINE-PRILOCAINE 2.5-2.5 % EX CREA: 1.0000 "application " | TOPICAL_CREAM | CUTANEOUS | 0 refills | Status: DC | PRN

## 2016-08-28 MED ORDER — VALSARTAN 160 MG PO TABS: 160.0000 mg | ORAL_TABLET | Freq: Every day | ORAL | 6 refills | Status: DC

## 2016-08-28 NOTE — Progress Notes (Addendum)
Subjective:     Brenda AlbrightGloria Evans is a 65 y.o. female and is here for a comprehensive physical exam. The patient reports no problems.  She is overdue for pap smear and has declined in the past but is willing to do it today.  She is not sexually active.    Social History   Social History  . Marital status: Divorced    Spouse name: N/A  . Number of children: N/A  . Years of education: N/A   Occupational History  . Not on file.   Social History Main Topics  . Smoking status: Current Every Day Smoker    Packs/day: 1.00    Years: 50.00  . Smokeless tobacco: Not on file  . Alcohol use Yes  . Drug use: No  . Sexual activity: Not on file   Other Topics Concern  . Not on file   Social History Narrative  . No narrative on file   Health Maintenance  Topic Date Due  . INFLUENZA VACCINE  10/31/2016 (Originally 06/05/2016)  . COLONOSCOPY  10/31/2016 (Originally 09/04/2002)  . PAP SMEAR  05/04/2017 (Originally 07/05/2013)  . ZOSTAVAX  05/04/2017 (Originally 09/04/2012)  . MAMMOGRAM  04/11/2018 (Originally 01/11/2011)  . Hepatitis C Screening  05/04/2018 (Originally July 27, 1951)  . HIV Screening  05/04/2018 (Originally 09/05/1967)  . TETANUS/TDAP  07/05/2020    The following portions of the patient's history were reviewed and updated as appropriate: allergies, current medications, past family history, past medical history, past social history, past surgical history and problem list.  Review of Systems A comprehensive review of systems was negative.   Objective:    There were no vitals taken for this visit. General appearance: alert, cooperative and appears stated age Head: Normocephalic, without obvious abnormality, atraumatic Eyes: conj claer, EOMI,  Ears: normal TM's and external ear canals both ears Nose: Nares normal. Septum midline. Mucosa normal. No drainage or sinus tenderness. Throat: lips, mucosa, and tongue normal; teeth and gums normal Neck: no adenopathy, no carotid bruit, no  JVD, supple, symmetrical, trachea midline and thyroid not enlarged, symmetric, no tenderness/mass/nodules Back: symmetric, no curvature. ROM normal. No CVA tenderness. Lungs: clear to auscultation bilaterally Breasts: normal appearance, no masses or tenderness Heart: regular rate and rhythm, S1, S2 normal, no murmur, click, rub or gallop Abdomen: soft, non-tender; bowel sounds normal; no masses,  no organomegaly Pelvic: cervix normal in appearance, external genitalia normal, no adnexal masses or tenderness, no cervical motion tenderness, rectovaginal septum normal, uterus normal size, shape, and consistency and vagina normal without discharge Extremities: extremities normal, atraumatic, no cyanosis or edema Pulses: 2+ and symmetric Skin: Skin color, texture, turgor normal. No rashes or lesions Lymph nodes: Cervical, supraclavicular, and axillary nodes normal. Neurologic: Alert and oriented X 3, normal strength and tone. Normal symmetric reflexes. Normal coordination and gait    Assessment:    Healthy female exam.      Plan:     See After Visit Summary for Counseling Recommendations   Keep up a regular exercise program and make sure you are eating a healthy diet Try to eat 4 servings of dairy a day, or if you are lactose intolerant take a calcium with vitamin D daily.  Your vaccines are up to date.  Ordered mammogram. Have ordered twice before and she hasn't gone.    COPD - She did not like the Spiriva rescue not as it made her cough and would like to go back to the capsules device.    She's also been having  laser hair removal done on her face and wants to know if there is anything that she can put on before the treatment. Will send over new prescription for EMLA cream.

## 2016-08-28 NOTE — Patient Instructions (Addendum)
Health Maintenance, Female Adopting a healthy lifestyle and getting preventive care can go a long way to promote health and wellness. Talk with your health care provider about what schedule of regular examinations is right for you. This is a good chance for you to check in with your provider about disease prevention and staying healthy. In between checkups, there are plenty of things you can do on your own. Experts have done a lot of research about which lifestyle changes and preventive measures are most likely to keep you healthy. Ask your health care provider for more information. WEIGHT AND DIET  Eat a healthy diet  Be sure to include plenty of vegetables, fruits, low-fat dairy products, and lean protein.  Do not eat a lot of foods high in solid fats, added sugars, or salt.  Get regular exercise. This is one of the most important things you can do for your health.  Most adults should exercise for at least 150 minutes each week. The exercise should increase your heart rate and make you sweat (moderate-intensity exercise).  Most adults should also do strengthening exercises at least twice a week. This is in addition to the moderate-intensity exercise.  Maintain a healthy weight  Body mass index (BMI) is a measurement that can be used to identify possible weight problems. It estimates body fat based on height and weight. Your health care provider can help determine your BMI and help you achieve or maintain a healthy weight.  For females 20 years of age and older:   A BMI below 18.5 is considered underweight.  A BMI of 18.5 to 24.9 is normal.  A BMI of 25 to 29.9 is considered overweight.  A BMI of 30 and above is considered obese.  Watch levels of cholesterol and blood lipids  You should start having your blood tested for lipids and cholesterol at 64 years of age, then have this test every 5 years.  You may need to have your cholesterol levels checked more often if:  Your lipid  or cholesterol levels are high.  You are older than 64 years of age.  You are at high risk for heart disease.  CANCER SCREENING   Lung Cancer  Lung cancer screening is recommended for adults 55-80 years old who are at high risk for lung cancer because of a history of smoking.  A yearly low-dose CT scan of the lungs is recommended for people who:  Currently smoke.  Have quit within the past 15 years.  Have at least a 30-pack-year history of smoking. A pack year is smoking an average of one pack of cigarettes a day for 1 year.  Yearly screening should continue until it has been 15 years since you quit.  Yearly screening should stop if you develop a health problem that would prevent you from having lung cancer treatment.  Breast Cancer  Practice breast self-awareness. This means understanding how your breasts normally appear and feel.  It also means doing regular breast self-exams. Let your health care provider know about any changes, no matter how small.  If you are in your 20s or 30s, you should have a clinical breast exam (CBE) by a health care provider every 1-3 years as part of a regular health exam.  If you are 40 or older, have a CBE every year. Also consider having a breast X-ray (mammogram) every year.  If you have a family history of breast cancer, talk to your health care provider about genetic screening.  If you   are at high risk for breast cancer, talk to your health care provider about having an MRI and a mammogram every year.  Breast cancer gene (BRCA) assessment is recommended for women who have family members with BRCA-related cancers. BRCA-related cancers include:  Breast.  Ovarian.  Tubal.  Peritoneal cancers.  Results of the assessment will determine the need for genetic counseling and BRCA1 and BRCA2 testing. Cervical Cancer Your health care provider may recommend that you be screened regularly for cancer of the pelvic organs (ovaries, uterus, and  vagina). This screening involves a pelvic examination, including checking for microscopic changes to the surface of your cervix (Pap test). You may be encouraged to have this screening done every 3 years, beginning at age 21.  For women ages 30-65, health care providers may recommend pelvic exams and Pap testing every 3 years, or they may recommend the Pap and pelvic exam, combined with testing for human papilloma virus (HPV), every 5 years. Some types of HPV increase your risk of cervical cancer. Testing for HPV may also be done on women of any age with unclear Pap test results.  Other health care providers may not recommend any screening for nonpregnant women who are considered low risk for pelvic cancer and who do not have symptoms. Ask your health care provider if a screening pelvic exam is right for you.  If you have had past treatment for cervical cancer or a condition that could lead to cancer, you need Pap tests and screening for cancer for at least 20 years after your treatment. If Pap tests have been discontinued, your risk factors (such as having a new sexual partner) need to be reassessed to determine if screening should resume. Some women have medical problems that increase the chance of getting cervical cancer. In these cases, your health care provider may recommend more frequent screening and Pap tests. Colorectal Cancer  This type of cancer can be detected and often prevented.  Routine colorectal cancer screening usually begins at 64 years of age and continues through 64 years of age.  Your health care provider may recommend screening at an earlier age if you have risk factors for colon cancer.  Your health care provider may also recommend using home test kits to check for hidden blood in the stool.  A small camera at the end of a tube can be used to examine your colon directly (sigmoidoscopy or colonoscopy). This is done to check for the earliest forms of colorectal  cancer.  Routine screening usually begins at age 50.  Direct examination of the colon should be repeated every 5-10 years through 64 years of age. However, you may need to be screened more often if early forms of precancerous polyps or small growths are found. Skin Cancer  Check your skin from head to toe regularly.  Tell your health care provider about any new moles or changes in moles, especially if there is a change in a mole's shape or color.  Also tell your health care provider if you have a mole that is larger than the size of a pencil eraser.  Always use sunscreen. Apply sunscreen liberally and repeatedly throughout the day.  Protect yourself by wearing long sleeves, pants, a wide-brimmed hat, and sunglasses whenever you are outside. HEART DISEASE, DIABETES, AND HIGH BLOOD PRESSURE   High blood pressure causes heart disease and increases the risk of stroke. High blood pressure is more likely to develop in:  People who have blood pressure in the high end   of the normal range (130-139/85-89 mm Hg).  People who are overweight or obese.  People who are African American.  If you are 38-23 years of age, have your blood pressure checked every 3-5 years. If you are 61 years of age or older, have your blood pressure checked every year. You should have your blood pressure measured twice--once when you are at a hospital or clinic, and once when you are not at a hospital or clinic. Record the average of the two measurements. To check your blood pressure when you are not at a hospital or clinic, you can use:  An automated blood pressure machine at a pharmacy.  A home blood pressure monitor.  If you are between 45 years and 39 years old, ask your health care provider if you should take aspirin to prevent strokes.  Have regular diabetes screenings. This involves taking a blood sample to check your fasting blood sugar level.  If you are at a normal weight and have a low risk for diabetes,  have this test once every three years after 64 years of age.  If you are overweight and have a high risk for diabetes, consider being tested at a younger age or more often. PREVENTING INFECTION  Hepatitis B  If you have a higher risk for hepatitis B, you should be screened for this virus. You are considered at high risk for hepatitis B if:  You were born in a country where hepatitis B is common. Ask your health care provider which countries are considered high risk.  Your parents were born in a high-risk country, and you have not been immunized against hepatitis B (hepatitis B vaccine).  You have HIV or AIDS.  You use needles to inject street drugs.  You live with someone who has hepatitis B.  You have had sex with someone who has hepatitis B.  You get hemodialysis treatment.  You take certain medicines for conditions, including cancer, organ transplantation, and autoimmune conditions. Hepatitis C  Blood testing is recommended for:  Everyone born from [birth year] through 1964.  Anyone with known risk factors for hepatitis C. Sexually transmitted infections (STIs)  You should be screened for sexually transmitted infections (STIs) including gonorrhea and chlamydia if:  You are sexually active and are younger than 64 years of age.  You are older than 64 years of age and your health care provider tells you that you are at risk for this type of infection.  Your sexual activity has changed since you were last screened and you are at an increased risk for chlamydia or gonorrhea. Ask your health care provider if you are at risk.  If you do not have HIV, but are at risk, it may be recommended that you take a prescription medicine daily to prevent HIV infection. This is called pre-exposure prophylaxis (PrEP). You are considered at risk if:  You are sexually active and do not regularly use condoms or know the HIV status of your partner(s).  You take drugs by injection.  You are sexually  active with a partner who has HIV. Talk with your health care provider about whether you are at high risk of being infected with HIV. If you choose to begin PrEP, you should first be tested for HIV. You should then be tested every 3 months for as long as you are taking PrEP.  PREGNANCY   If you are premenopausal and you may become pregnant, ask your health care provider about preconception counseling.  If you may  become pregnant, take 400 to 800 micrograms (mcg) of folic acid every day.  If you want to prevent pregnancy, talk to your health care provider about birth control (contraception). OSTEOPOROSIS AND MENOPAUSE   Osteoporosis is a disease in which the bones lose minerals and strength with aging. This can result in serious bone fractures. Your risk for osteoporosis can be identified using a bone density scan.  If you are 61 years of age or older, or if you are at risk for osteoporosis and fractures, ask your health care provider if you should be screened.  Ask your health care provider whether you should take a calcium or vitamin D supplement to lower your risk for osteoporosis.  Menopause may have certain physical symptoms and risks.  Hormone replacement therapy may reduce some of these symptoms and risks. Talk to your health care provider about whether hormone replacement therapy is right for you.  HOME CARE INSTRUCTIONS   Schedule regular health, dental, and eye exams.  Stay current with your immunizations.   Do not use any tobacco products including cigarettes, chewing tobacco, or electronic cigarettes.  If you are pregnant, do not drink alcohol.  If you are breastfeeding, limit how much and how often you drink alcohol.  Limit alcohol intake to no more than 1 drink per day for nonpregnant women. One drink equals 12 ounces of beer, 5 ounces of wine, or 1 ounces of hard liquor.  Do not use street drugs.  Do not share needles.  Ask your health care provider for help if  you need support or information about quitting drugs.  Tell your health care provider if you often feel depressed.  Tell your health care provider if you have ever been abused or do not feel safe at home.   This information is not intended to replace advice given to you by your health care provider. Make sure you discuss any questions you have with your health care provider.   Document Released: 05/07/2011 Document Revised: 11/12/2014 Document Reviewed: 09/23/2013 Elsevier Interactive Patient Education [employer].

## 2016-08-28 NOTE — Addendum Note (Signed)
Addended by: Nani GasserMETHENEY, CATHERINE D on: 08/28/2016 10:10 AM   Modules accepted: Orders

## 2016-08-30 LAB — CYTOLOGY - PAP
ADEQUACY: ABSENT
Diagnosis: NEGATIVE
HPV: NOT DETECTED

## 2016-09-03 NOTE — Progress Notes (Signed)
Call patient: Your Pap smear is normal. Repeat in 5 years.

## 2016-09-15 ENCOUNTER — Other Ambulatory Visit: Payer: Self-pay | Admitting: Family Medicine

## 2016-09-15 DIAGNOSIS — F17200 Nicotine dependence, unspecified, uncomplicated: Secondary | ICD-10-CM

## 2016-09-15 DIAGNOSIS — J449 Chronic obstructive pulmonary disease, unspecified: Secondary | ICD-10-CM

## 2016-11-01 ENCOUNTER — Encounter: Payer: Self-pay | Admitting: Family Medicine

## 2016-11-01 ENCOUNTER — Ambulatory Visit (INDEPENDENT_AMBULATORY_CARE_PROVIDER_SITE_OTHER): Payer: BLUE CROSS/BLUE SHIELD | Admitting: Family Medicine

## 2016-11-01 VITALS — BP 129/64 | HR 87 | Ht 60.0 in | Wt 158.0 lb

## 2016-11-01 DIAGNOSIS — J209 Acute bronchitis, unspecified: Secondary | ICD-10-CM

## 2016-11-01 LAB — COLOGUARD

## 2016-11-01 NOTE — Patient Instructions (Addendum)
If you need to take prednisone take 40mg  once a day for 5 days.  Acute Bronchitis, Adult Acute bronchitis is when air tubes (bronchi) in the lungs suddenly get swollen. The condition can make it hard to breathe. It can also cause these symptoms:  A cough.  Coughing up clear, yellow, or green mucus.  Wheezing.  Chest congestion.  Shortness of breath.  A fever.  Body aches.  Chills.  A sore throat. Follow these instructions at home: Medicines  Take over-the-counter and prescription medicines only as told by your doctor.  If you were prescribed an antibiotic medicine, take it as told by your doctor. Do not stop taking the antibiotic even if you start to feel better. General instructions  Rest.  Drink enough fluids to keep your pee (urine) clear or pale yellow.  Avoid smoking and secondhand smoke. If you smoke and you need help quitting, ask your doctor. Quitting will help your lungs heal faster.  Use an inhaler, cool mist vaporizer, or humidifier as told by your doctor.  Keep all follow-up visits as told by your doctor. This is important. How is this prevented? To lower your risk of getting this condition again:  Wash your hands often with soap and water. If you cannot use soap and water, use hand sanitizer.  Avoid contact with people who have cold symptoms.  Try not to touch your hands to your mouth, nose, or eyes.  Make sure to get the flu shot every year. Contact a doctor if:  Your symptoms do not get better in 2 weeks. Get help right away if:  You cough up blood.  You have chest pain.  You have very bad shortness of breath.  You become dehydrated.  You faint (pass out) or keep feeling like you are going to pass out.  You keep throwing up (vomiting).  You have a very bad headache.  Your fever or chills gets worse. This information is not intended to replace advice given to you by your health care provider. Make sure you discuss any questions you  have with your health care provider. Document Released: 04/09/2008 Document Revised: 05/30/2016 Document Reviewed: 04/11/2016 Elsevier Interactive Patient Education  2017 [employer]

## 2016-11-01 NOTE — Progress Notes (Signed)
   Subjective:    Patient ID: Brenda AlbrightGloria Evans, female    DOB: 26-Oct-1951, 65 y.o.   MRN: [id number]  HPI  Cough x 2 1/2  wks, pt reports that when the sxs first began she did feel cold,achey has not felt that way since. she denies any color to the mucus. she has been using nyquil, and mucinex. she states that the congestion is mostly in her chest.  No significant nasal congestion. No sore throat. She does have some voice hoarseness. No GI symptoms. She feels like she is better than she was last week. She said she did take some old doxycycline as well.  Review of Systems     Objective:   Physical Exam  Constitutional: She is oriented to person, place, and time. She appears well-developed and well-nourished.  HENT:  Head: Normocephalic and atraumatic.  Right Ear: External ear normal.  Left Ear: External ear normal.  Nose: Nose normal.  Mouth/Throat: Oropharynx is clear and moist.  TMs and canals are clear.   Eyes: Conjunctivae and EOM are normal. Pupils are equal, round, and reactive to light.  Neck: Neck supple. No thyromegaly present.  Cardiovascular: Normal rate, regular rhythm and normal heart sounds.   Pulmonary/Chest: Effort normal and breath sounds normal. She has no wheezes.  Lymphadenopathy:    She has no cervical adenopathy.  Neurological: She is alert and oriented to person, place, and time.  Skin: Skin is warm and dry.  Psychiatric: She has a normal mood and affect.          Assessment & Plan:  Acute bronchitis - Likely viral. I really do think she is actually getting a lot better on her own. Did offer to send her in 5 days of prednisone if needed since she also has underlying COPD. She declined and says she actually think she still has some at home. I'll is not better after one week.

## 2016-12-19 ENCOUNTER — Ambulatory Visit (INDEPENDENT_AMBULATORY_CARE_PROVIDER_SITE_OTHER): Payer: BLUE CROSS/BLUE SHIELD | Admitting: Family Medicine

## 2016-12-19 ENCOUNTER — Encounter: Payer: Self-pay | Admitting: Family Medicine

## 2016-12-19 VITALS — BP 138/62 | HR 75 | Ht 60.0 in | Wt 157.0 lb

## 2016-12-19 DIAGNOSIS — B029 Zoster without complications: Secondary | ICD-10-CM

## 2016-12-19 MED ORDER — VALACYCLOVIR HCL 1 G PO TABS: 1000.0000 mg | ORAL_TABLET | Freq: Three times a day (TID) | ORAL | 0 refills | Status: DC

## 2016-12-19 MED ORDER — TRAMADOL HCL 50 MG PO TABS: 50.0000 mg | ORAL_TABLET | Freq: Three times a day (TID) | ORAL | 0 refills | Status: DC | PRN

## 2016-12-19 MED ORDER — LIDOCAINE-PRILOCAINE 2.5-2.5 % EX CREA: 1.0000 "application " | TOPICAL_CREAM | Freq: Two times a day (BID) | CUTANEOUS | 0 refills | Status: DC | PRN

## 2016-12-19 NOTE — Addendum Note (Signed)
Addended by: Deno EtienneBARKLEY, Dariel Pellecchia L on: 12/19/2016 12:31 PM   Modules accepted: Orders

## 2016-12-19 NOTE — Patient Instructions (Addendum)
Shingles Shingles is an infection that causes a painful skin rash and fluid-filled blisters. Shingles is caused by the same virus that causes chickenpox. Shingles only develops in people who:  Have had chickenpox.  Have gotten the chickenpox vaccine. (This is rare.) The first symptoms of shingles may be itching, tingling, or pain in an area on your skin. A rash will follow in a few days or weeks. The rash is usually on one side of the body in a bandlike or beltlike pattern. Over time, the rash turns into fluid-filled blisters that break open, scab over, and dry up. Medicines may:  Help you manage pain.  Help you recover more quickly.  Help to prevent long-term problems. Follow these instructions at home: Medicines  Take medicines only as told by your doctor.  Apply an anti-itch or numbing cream to the affected area as told by your doctor. Blister and Rash Care  Take a cool bath or put cool compresses on the area of the rash or blisters as told by your doctor. This may help with pain and itching.  Keep your rash covered with a loose bandage (dressing). Wear loose-fitting clothing.  Keep your rash and blisters clean with mild soap and cool water or as told by your doctor.  Check your rash every day for signs of infection. These include redness, swelling, and pain that lasts or gets worse.  Do not pick your blisters.  Do not scratch your rash. General instructions  Rest as told by your doctor.  Keep all follow-up visits as told by your doctor. This is important.  Until your blisters scab over, your infection can cause chickenpox in people who have never had it or been vaccinated against it. To prevent this from happening, avoid touching other people or being around other people, especially:  Babies.  Pregnant women.  Children who have eczema.  Elderly people who have transplants.  People who have chronic illnesses, such as leukemia or AIDS. Contact a doctor if:  Your  pain does not get better with medicine.  Your pain does not get better after the rash heals.  Your rash looks infected. Signs of infection include:  Redness.  Swelling.  Pain that lasts or gets worse. Get help right away if:  The rash is on your face or nose.  You have pain in your face, pain around your eye area, or loss of feeling on one side of your face.  You have ear pain or you have ringing in your ear.  You have loss of taste.  Your condition gets worse. This information is not intended to replace advice given to you by your health care provider. Make sure you discuss any questions you have with your health care provider. Document Released: 04/09/2008 Document Revised: 06/17/2016 Document Reviewed: 08/03/2014 Elsevier Interactive Patient Education  2017 Elsevier Inc.  

## 2016-12-19 NOTE — Progress Notes (Signed)
Subjective:    CC: left chest pain.     HPI:  pt reports pain under her L breast x1 wk that radiates around to her back. she denies any pain with deep breaths or coughing. denies injury/trauma, nausea,vomiting,chills,sweats.she has not had anything to eat this morning and has only had 2 cups of coffee with cream and sugar at 5 am and a sip of diet pepsi right before she got here.Motrin is the only thing that provides relief. She denies any rash or skin changes.  Past medical history, Surgical history, Family history not pertinant except as noted below, Social history, Allergies, and medications have been entered into the medical record, reviewed, and corrections made.   Review of Systems: No fevers, chills, night sweats, weight loss, chest pain, or shortness of breath.   Objective:    General: Well Developed, well nourished, and in no acute distress.  Neuro: Alert and oriented x3, extra-ocular muscles intact, sensation grossly intact.  HEENT: Normocephalic, atraumatic  Skin: Warm and dry,She has an erythematous macular papular rash along the dermatome underneath the left breast extending to her back. There are a few vesicles in the rash.  Open wounds and no open drainage. Check she did not notice the rash until during the examination and the brain today. She said it was not there yesterday. Cardiac: Regular rate and rhythm, no murmurs rubs or gallops, no lower extremity edema.  Respiratory: Clear to auscultation bilaterally. Not using accessory muscles, speaking in full sentences.   Impression and Recommendations:   Shingles - discussed diagnosis. Did do swab for confirmation. We'll start valacyclovir and refill her EMLA cream which she has used before for other reasons to help numb the area consider perception for tramadol. Okay to use Motrin since I also provide some pain relief as well.

## 2016-12-23 ENCOUNTER — Other Ambulatory Visit: Payer: Self-pay | Admitting: Family Medicine

## 2016-12-23 LAB — RFLXH. SIMPLEX/VZ VIRUS CULT/DIF

## 2016-12-24 ENCOUNTER — Encounter: Payer: Self-pay | Admitting: Family Medicine

## 2016-12-24 MED ORDER — HYDROCODONE-ACETAMINOPHEN 5-325 MG PO TABS: 1.0000 | ORAL_TABLET | Freq: Four times a day (QID) | ORAL | 0 refills | Status: DC | PRN

## 2016-12-24 MED ORDER — GABAPENTIN 300 MG PO CAPS
ORAL_CAPSULE | ORAL | 3 refills | Status: DC
Start: 2016-12-24 — End: 2017-06-24

## 2016-12-25 ENCOUNTER — Other Ambulatory Visit: Payer: Self-pay | Admitting: Family Medicine

## 2016-12-25 MED ORDER — LIDOCAINE-PRILOCAINE 2.5-2.5 % EX CREA: 1.0000 "application " | TOPICAL_CREAM | Freq: Two times a day (BID) | CUTANEOUS | 0 refills | Status: DC | PRN

## 2016-12-25 NOTE — Telephone Encounter (Signed)
Routed to PCP for refill review

## 2016-12-26 LAB — VIRAL CULTURE VIRC

## 2017-01-01 ENCOUNTER — Other Ambulatory Visit: Payer: Self-pay

## 2017-01-01 ENCOUNTER — Encounter: Payer: Self-pay | Admitting: Family Medicine

## 2017-01-01 MED ORDER — HYDROCODONE-ACETAMINOPHEN 5-325 MG PO TABS: 1.0000 | ORAL_TABLET | Freq: Four times a day (QID) | ORAL | 0 refills | Status: DC | PRN

## 2017-01-01 MED ORDER — LIDOCAINE-PRILOCAINE 2.5-2.5 % EX CREA: 1.0000 "application " | TOPICAL_CREAM | Freq: Two times a day (BID) | CUTANEOUS | 0 refills | Status: DC | PRN

## 2017-04-29 ENCOUNTER — Other Ambulatory Visit: Payer: Self-pay | Admitting: Family Medicine

## 2017-04-29 DIAGNOSIS — F17200 Nicotine dependence, unspecified, uncomplicated: Secondary | ICD-10-CM

## 2017-04-29 DIAGNOSIS — J449 Chronic obstructive pulmonary disease, unspecified: Secondary | ICD-10-CM

## 2017-04-30 ENCOUNTER — Encounter: Payer: Self-pay | Admitting: Family Medicine

## 2017-06-02 ENCOUNTER — Other Ambulatory Visit: Payer: Self-pay | Admitting: Family Medicine

## 2017-06-03 NOTE — Telephone Encounter (Signed)
Valsartan is on recall

## 2017-06-04 ENCOUNTER — Telehealth: Payer: Self-pay | Admitting: Osteopathic Medicine

## 2017-06-04 MED ORDER — LOSARTAN POTASSIUM 100 MG PO TABS: 100.0000 mg | ORAL_TABLET | Freq: Every day | ORAL | 1 refills | Status: DC

## 2017-06-04 NOTE — Telephone Encounter (Signed)
Sent alternative

## 2017-06-04 NOTE — Telephone Encounter (Signed)
Valsartan recalled, pharmacy requested alternative. Sent. Please have patient schedule nurse visit to recheck blood pressure in 2 weeks

## 2017-06-05 NOTE — Telephone Encounter (Signed)
Patient notified

## 2017-06-05 NOTE — Telephone Encounter (Signed)
Patient was aware.

## 2017-06-24 ENCOUNTER — Ambulatory Visit (INDEPENDENT_AMBULATORY_CARE_PROVIDER_SITE_OTHER): Payer: BLUE CROSS/BLUE SHIELD | Admitting: Family Medicine

## 2017-06-24 VITALS — BP 133/53 | HR 72

## 2017-06-24 DIAGNOSIS — I10 Essential (primary) hypertension: Secondary | ICD-10-CM

## 2017-06-24 NOTE — Progress Notes (Signed)
HTN - Well controlled. Continue current regimen. Follow up for regularly schedule appt.    Nani Gasser, MD

## 2017-06-24 NOTE — Progress Notes (Signed)
Pt advised,verbalized understanding. 

## 2017-06-24 NOTE — Progress Notes (Signed)
Pt came into clinic today for BP check. Pt was switched from valsartan to losartan based on a recall. Pt reports no negative side effects from the Rx change. Pt's BP was borderline on first check, but did get better after sitting for 5 min. Advised to continue current Rx's and we would contact her with any changes. Pt verbalized understanding, no further questions.

## 2017-08-09 ENCOUNTER — Encounter: Payer: Self-pay | Admitting: Family Medicine

## 2017-08-09 MED ORDER — UMECLIDINIUM BROMIDE 62.5 MCG/INH IN AEPB: 1.0000 | INHALATION_SPRAY | Freq: Every day | RESPIRATORY_TRACT | 5 refills | Status: DC

## 2017-08-09 NOTE — Telephone Encounter (Signed)
Okay to prescription sent for Incruse. I think she will like it and it's once a day just like Spiriva.

## 2017-08-12 ENCOUNTER — Other Ambulatory Visit: Payer: Self-pay

## 2017-08-12 MED ORDER — UMECLIDINIUM BROMIDE 62.5 MCG/INH IN AEPB: 1.0000 | INHALATION_SPRAY | Freq: Every day | RESPIRATORY_TRACT | 5 refills | Status: DC

## 2017-11-08 ENCOUNTER — Ambulatory Visit: Payer: BLUE CROSS/BLUE SHIELD

## 2017-11-12 ENCOUNTER — Ambulatory Visit (INDEPENDENT_AMBULATORY_CARE_PROVIDER_SITE_OTHER): Payer: Medicare Other | Admitting: Family Medicine

## 2017-11-12 VITALS — BP 144/60 | HR 75 | Temp 98.6°F

## 2017-11-12 DIAGNOSIS — Z23 Encounter for immunization: Secondary | ICD-10-CM

## 2017-11-12 MED ORDER — HYDROCHLOROTHIAZIDE 12.5 MG PO CAPS: 12.5000 mg | ORAL_CAPSULE | Freq: Every day | ORAL | 0 refills | Status: DC

## 2017-11-12 MED ORDER — ZOSTER VAC RECOMB ADJUVANTED 50 MCG/0.5ML IM SUSR: 0.5000 mL | Freq: Once | INTRAMUSCULAR | 1 refills | Status: AC

## 2017-11-12 NOTE — Progress Notes (Signed)
Pt came into clinic today for Prevnar13 and HD Flu. Pt tolerated both injections (HD flu in left deltoid, Prevnar13 in right deltoid) well, no immediate complications. Pt also inquired about ShingRix, based on insurance we will send Rx to pharmacy. Pt's BP was elevated in office. Per PCP adding HCTZ 12.5mg  daily, Pt to return in 2 weeks for [place] BP check. She will bring a home log of BP readings for review. No further questions.

## 2017-11-12 NOTE — Progress Notes (Signed)
Agree with documentation as above.   Catherine Metheney, MD  

## 2017-11-29 ENCOUNTER — Other Ambulatory Visit: Payer: Self-pay | Admitting: Osteopathic Medicine

## 2017-12-05 ENCOUNTER — Ambulatory Visit (INDEPENDENT_AMBULATORY_CARE_PROVIDER_SITE_OTHER): Payer: Medicare Other | Admitting: Family Medicine

## 2017-12-05 ENCOUNTER — Encounter: Payer: Self-pay | Admitting: Family Medicine

## 2017-12-05 VITALS — BP 138/56 | HR 76 | Ht 60.0 in | Wt 161.0 lb

## 2017-12-05 DIAGNOSIS — Z13220 Encounter for screening for lipoid disorders: Secondary | ICD-10-CM | POA: Diagnosis not present

## 2017-12-05 DIAGNOSIS — Z720 Tobacco use: Secondary | ICD-10-CM

## 2017-12-05 DIAGNOSIS — M810 Age-related osteoporosis without current pathological fracture: Secondary | ICD-10-CM | POA: Diagnosis not present

## 2017-12-05 DIAGNOSIS — I10 Essential (primary) hypertension: Secondary | ICD-10-CM | POA: Diagnosis not present

## 2017-12-05 DIAGNOSIS — Z1321 Encounter for screening for nutritional disorder: Secondary | ICD-10-CM | POA: Diagnosis not present

## 2017-12-05 DIAGNOSIS — Z1329 Encounter for screening for other suspected endocrine disorder: Secondary | ICD-10-CM

## 2017-12-05 DIAGNOSIS — J449 Chronic obstructive pulmonary disease, unspecified: Secondary | ICD-10-CM | POA: Diagnosis not present

## 2017-12-05 MED ORDER — UMECLIDINIUM BROMIDE 62.5 MCG/INH IN AEPB: 1.0000 | INHALATION_SPRAY | Freq: Every day | RESPIRATORY_TRACT | 1 refills | Status: DC

## 2017-12-05 MED ORDER — ALBUTEROL SULFATE HFA 108 (90 BASE) MCG/ACT IN AERS: 2.0000 | INHALATION_SPRAY | Freq: Four times a day (QID) | RESPIRATORY_TRACT | 4 refills | Status: DC | PRN

## 2017-12-05 MED ORDER — HYDROCHLOROTHIAZIDE 12.5 MG PO CAPS: 12.5000 mg | ORAL_CAPSULE | Freq: Every day | ORAL | 1 refills | Status: DC

## 2017-12-05 MED ORDER — LOSARTAN POTASSIUM 100 MG PO TABS: 100.0000 mg | ORAL_TABLET | Freq: Every day | ORAL | 1 refills | Status: DC

## 2017-12-05 NOTE — Progress Notes (Signed)
Subjective:    CC: BP check.   HPI:  Hypertension- Pt denies chest pain, SOB, dizziness, or heart palpitations.  Taking meds as directed w/o problems.  Denies medication side effects.  She often checks her blood pressure twice a day usually in the mornings and the evenings.  All of her home blood pressures look fantastic.  Mostly in the 1 teens to the 120s.  She did bring in home blood pressure log.  She also brought in a handicap placard form for me to complete today.  F/U  COPD - On INcruse and albuterol PRN.  She has not had any flares or exacerbations since she was last here.  Last spirometry was in 2014.  Osteoporosis - she is taking her vitamin D.   Tobacco abuse-she currently smokes more than a pack a day.  Past medical history, Surgical history, Family history not pertinant except as noted below, Social history, Allergies, and medications have been entered into the medical record, reviewed, and corrections made.   Review of Systems: No fevers, chills, night sweats, weight loss, chest pain, or shortness of breath.   Objective:    General: Well Developed, well nourished, and in no acute distress.  Neuro: Alert and oriented x3, extra-ocular muscles intact, sensation grossly intact.  HEENT: Normocephalic, atraumatic  Skin: Warm and dry, no rashes. Cardiac: Regular rate and rhythm, no murmurs rubs or gallops, no lower extremity edema.  Respiratory: Clear to auscultation bilaterally. Not using accessory muscles, speaking in full sentences.   Impression and Recommendations:    HTN - Well controlled. Continue current regimen. Follow up in  6months.    COPD -  Stable. Doing well. No recent flares. Due for refill on her alubterol.  We will schedule for spirometry in April.  Osteoporosis-we will check vitamin D level.  Continue over-the-counter supplementation.  Also encouraged more regular exercise.  Tobacco abuse-encourage cutting back.  She says she is actually going to  Tobacco  abuse-she want to try the nicotine patch.  I just reminded her to make sure that she takes it off at night as it can cause nightmares.  She declines mammography today.  She also declines colonoscopy but says she would be willing to do the Cologuard.  Order sent.

## 2018-04-23 ENCOUNTER — Ambulatory Visit (INDEPENDENT_AMBULATORY_CARE_PROVIDER_SITE_OTHER): Payer: Medicare Other | Admitting: Family Medicine

## 2018-04-23 ENCOUNTER — Encounter: Payer: Self-pay | Admitting: Family Medicine

## 2018-04-23 VITALS — BP 120/50 | HR 76 | Wt 162.0 lb

## 2018-04-23 DIAGNOSIS — F17200 Nicotine dependence, unspecified, uncomplicated: Secondary | ICD-10-CM

## 2018-04-23 DIAGNOSIS — M79661 Pain in right lower leg: Secondary | ICD-10-CM | POA: Diagnosis not present

## 2018-04-23 MED ORDER — DICLOFENAC SODIUM 1 % TD GEL: 4.0000 g | Freq: Four times a day (QID) | TRANSDERMAL | 11 refills | Status: DC

## 2018-04-23 NOTE — Progress Notes (Signed)
Brenda AlbrightGloria Evans is a 67 y.o. female [facility] today for right calf pain.  Ms. Brenda Evans has a several week history of right lateral calf pain.  She notes the pain occurred without injury.  The pain is worse with exertion and better with rest.  She denies significant swelling radiating pain weakness or numbness.  She is a pertinent orthopedic history for posterior tibialis tendinitis of the right ankle managed with podiatry about 2 years ago.  She has orthotics and did physical therapy exercises and notes that her medial ankle pain resolved and has not returned.  She is a current smoker and has hypertension and other CVD risk profiles.  She denies a personal history of claudication or peripheral arterial disease.   ROS as above:  Exam:  BP (!) 120/50   Pulse 76   Wt 162 lb (73.5 kg)   BMI 31.64 kg/m  Gen: Well NAD HEENT: EOMI,  MMM Lungs: Normal work of breathing. CTABL Heart: RRR no MRG Abd: NABS, Soft. Nondistended, Nontender Exts: Brisk capillary refill, warm and well perfused.  Right calf normal-appearing no significant swelling or ecchymosis.   Tender palpation at the musculotendinous junction at the lateral head of the gastrocnemius Normal foot and ankle motion.  Some pain with resisted foot plantarflexion. Pulses are diminished right foot but present.  Capillary refill and sensation are intact.  Lab and Radiology Results Limited musculoskeletal ultrasound of the left lateral gastrocnemius head shows mild hypoechoic fluid at the musculotendinous junction without definitive tear.  Posterior tibialis tendon at the medial ankle is intact with some hypoechoic fluid surrounding the tendon sheath at the tarsal tunnel.  Blood flow intact at the posterior tibialis artery as well as the dorsal pedis artery  Bony structures in the ankle are  unremarkable.   Assessment and Plan: 67 y.o. female with  Right calf pain is likely strain or injury of the gastrocnemius.  However she does have risk factors for peripheral arterial disease and exertional symptoms are concerning for claudication.  Pulses are mildly diminished but present to palpation and on ultrasound.  Plan for ABI to evaluate for possible claudication as a source of pain.  I think the most likely diagnosis is calf strain.  Plan for eccentric home exercises as well as diclofenac gel and calf sleeve.  Recheck in about 6 weeks.  Return sooner if needed.   No orders of the defined types were placed in this encounter.  Meds ordered this encounter  Medications  . diclofenac sodium (VOLTAREN) 1 % GEL    Sig: Apply 4 g topically 4 (four) times daily. To affected joint.    Dispense:  100 g    Refill:  11     Historical information moved to improve visibility of documentation.  Past Medical History:  Diagnosis Date  . Hypertension    Past Surgical History:  Procedure Laterality Date  . TONSILLECTOMY  67 yrs old  . TUBAL LIGATION     Social History   Tobacco Use  . Smoking status: Current Every Day Smoker    Packs/day: 1.00    Years: 50.00    Pack years: 50.00  . Smokeless tobacco: Never Used  Substance Use Topics  . Alcohol use: Yes   family history includes Heart attack in her other; Hypertension in her other; Stroke in her other.  Medications: Current Outpatient Medications  Medication Sig Dispense Refill  . albuterol (PROAIR HFA)  108 (90 Base) MCG/ACT inhaler Inhale 2 puffs into the lungs every 6 (six) hours as needed for wheezing or shortness of breath. 1 Inhaler 4  . aspirin 81 MG tablet Take 81 mg by mouth daily.      Marland Kitchen b complex vitamins tablet Take 1 tablet by mouth daily.    . hydrochlorothiazide (MICROZIDE) 12.5 MG capsule Take 1 capsule (12.5 mg total) by mouth daily. Return for nurse visit BP check in 2 weeks. 90 capsule 1  . losartan (COZAAR)  100 MG tablet Take 1 tablet (100 mg total) by mouth daily. Due for follow up visit 90 tablet 1  . umeclidinium bromide (INCRUSE ELLIPTA) 62.5 MCG/INH AEPB Inhale 1 puff into the lungs daily. 3 each 1  . vitamin D, CHOLECALCIFEROL, 400 UNITS tablet Take 400 Units by mouth daily.      . diclofenac sodium (VOLTAREN) 1 % GEL Apply 4 g topically 4 (four) times daily. To affected joint. 100 g 11   No current facility-administered medications for this visit.    Allergies  Allergen Reactions  . Allegra [Fexofenadine Hcl] Other (See Comments)    Muscle pain.   Marland Kitchen Fluticasone Other (See Comments)    Nasal sores    Health Maintenance Health Maintenance  Topic Date Due  . MAMMOGRAM  01/11/2011  . Hepatitis C Screening  05/04/2018 (Originally 1951/02/26)  . HIV Screening  05/04/2018 (Originally 09[birth year]/1967)  . DEXA SCAN  12/05/2018 (Originally 09/04/2017)  . COLONOSCOPY  12/05/2018 (Originally 09/04/2002)  . INFLUENZA VACCINE  06/05/2018  . PNA vac Low Risk Adult (2 of 2 - PPSV23) 11/12/2018  . TETANUS/TDAP  07/05/2020  . PAP SMEAR  08/28/2021    Discussed warning signs or symptoms. Please see discharge instructions. Patient expresses understanding.

## 2018-04-23 NOTE — Patient Instructions (Addendum)
Thank you for coming in today. Use diclofenac gel up to 4x daily.  Use a calf sleeve. I recommend BodyHelix full calf.  Do the heel slowly going down exercises on a step.   Do about 10-30 reps 2-3x daily.   You should hear about the blood flow test soon.  If you never hear anything let me know.   Recheck with me in 6 weeks.   Return sooner if needed.    Medial Head Gastrocnemius Tear Medial head gastrocnemius tear, also called tennis leg, is an injury to the inner part of the calf muscle. This injury may include overstretching of the muscle or a partial or complete tear. This is a common sports injury. Calf muscle tears usually occur near the back of the knee. This often causes sudden pain and muscle weakness. What are the causes? This condition is caused by forceful stretching or strain on the calf muscle. This usually happens when you forcefully push off of your foot. It may also happen if you forcefully straighten your knee while your foot is flat on the ground. What increases the risk? The following factors may make you more likely to develop this condition:  Being female and older than age 50.  Playing sports that involve: ? Quick increases in speed and changes of direction, such as tennis and soccer. ? Jumping, such as basketball. ? Running, especially uphill or on uneven ground.  What are the signs or symptoms? Symptoms of this condition include:  Sudden pain in the back of the leg when the injury occurs. You may hear a popping sound or feel like you got hit in your calf.  Pain that is made worse by bringing your toes up toward your shin or by straightening your knee.  Pain on the inside of your calf, from your knee to your ankle.  Not being able to rise up on your toes.  Pain when pressing on your calf muscle.  Swelling of your calf.  Bruising along your calf and lower leg, down to your ankle.  Difficulty pushing off your foot when walking or using stairs.  How  is this diagnosed? This condition may be diagnosed based on:  Your symptoms and medical history.  A physical exam. Your health care provider may be able to feel a lump or a defect in your muscle.  MRI or ultrasound to determine the severity and exact location of your injury.  How is this treated? Treatment for this condition may include:  Resting the muscle and keeping weight off your leg for several days. During this time, you may use crutches or another walking device.  Using a splint to keep your ankle or knee in a stable position.  Wearing a walking boot to decrease the use of your gastrocnemius muscle.  Using a wedge under your heel to reduce stretching of your healing muscle.  Wearing a compression sleeve around your calf muscle.  Icing the muscle.  Raising (elevating) your leg when resting.  Taking medicine for pain and swelling (NSAIDs or steroids).  Doing leg exercises as told by your health care provider or physical therapist.  Follow these instructions at home: If you have a splint or compression sleeve:  Wear it as told by your health care provider. Remove it only as told by your health care provider.  Loosen the splint if your toes tingle, become numb, or turn cold and blue.  If your splint or sleeve is not waterproof: ? Do not let it get  wet. ? Protect it with a watertight covering when you take a bath or a shower.  Keep the splint or sleeve clean. Managing pain, stiffness, and swelling  If directed, put ice on the injured area: ? Put ice in a plastic bag. ? Place a towel between your skin and the bag. ? Leave the ice on for 20 minutes, 2-3 times a day.  Move your toes often to avoid stiffness and to lessen swelling.  Raise (elevate) the injured area above the level of your heart while you are sitting or lying down. Driving  Do not drive or operate heavy machinery while taking prescription pain medicine.  Ask your health care provider when it is  safe to drive. Activity  Do not use the injured limb to support your body weight until your health care provider says that you can. Use crutches as told by your health care provider.  Return gradually to your normal activities as told by your health care provider. Ask your health care provider what activities are safe for you.  Do exercises as told by your health care provider.  Return to sporting activity only as told by your health care provider or physical therapist. Full recovery may take several months. General instructions  Take over-the-counter and prescription medicines only as told by your health care provider.  Keep all follow-up visits as told by your health care provider. This is important. How is this prevented?  Warm up and stretch before being active.  Cool down and stretch after being active.  Give your body time to rest between periods of activity.  Make sure to use equipment that fits you.  Be safe and responsible while being active to avoid falls.  Maintain physical fitness, including: ? Strength. ? Flexibility. Contact a health care provider if:  Your symptoms do not improve with rest and treatment. Get help right away if:  You have swelling or redness in your calf that is getting worse. This information is not intended to replace advice given to you by your health care provider. Make sure you discuss any questions you have with your health care provider. Document Released: 10/22/2005 Document Revised: 06/26/2016 Document Reviewed: 10/04/2015 Elsevier Interactive Patient Education  2018 [employer]   Intermittent Claudication Intermittent claudication is pain in your leg that occurs when you walk or exercise and goes away when you rest. The pain can occur in one or both legs. What are the causes? Intermittent claudication is caused by the buildup of plaque within the major arteries in the body (atherosclerosis). The plaque, which makes arteries stiff  and narrow, prevents enough blood from reaching your leg muscles. The pain occurs when you walk or exercise because your muscles need more blood when you are moving and exercising. What increases the risk? Risk factors include:  A family history of atherosclerosis.  A personal history of stroke or heart disease.  Older age.  Being inactive or overweight.  Smoking cigarettes.  Having another health condition such as: ? Diabetes. ? High blood pressure. ? High cholesterol.  What are the signs or symptoms? Your hip or leg may:  Ache.  Cramp.  Feel tight.  Feel weak.  Feel heavy.  Over time, you may feel pain in your calf, thigh, or hip. How is this diagnosed? Your health care provider may diagnose intermittent claudication based on your symptoms and medical history. Your health care provider may also do tests to learn more about your condition. These may include:  Blood tests.  An ultrasound.  Imaging tests such as angiography, magnetic resonance angiography (MRA), and computed tomography angiography (CTA).  How is this treated? You may be treated for problems such as:  High blood pressure.  High cholesterol.  Diabetes.  Other treatments may include:  Lifestyle changes such as: ? Starting an exercise program. ? Losing weight. ? Quitting smoking.  Medicines to help restore blood flow through your legs.  Blood vessel surgery (angioplasty) to restore blood flow if your intermittent claudication is caused by severe peripheral artery disease.  Follow these instructions at home:  Manage any other health conditions you have.  Eat a diet low in saturated fats and calories to maintain a healthy weight.  Quit smoking, if you smoke.  Take medicines only as directed by your health care provider.  If your health care provider recommended an exercise program for you, follow it as directed. Your exercise program may involve: ? Walking three or more times a  week. ? Walking until you have certain symptoms of intermittent claudication. ? Resting until symptoms go away. ? Gradually increasing walking time to about 50 minutes a day. Contact a health care provider if: Your condition is not getting better or is getting worse. Get help right away if:  You have chest pain.  You have difficulty breathing.  You develop arm weakness.  You have trouble speaking.  Your face begins to droop. This information is not intended to replace advice given to you by your health care provider. Make sure you discuss any questions you have with your health care provider. Document Released: 08/24/2004 Document Revised: 03/29/2016 Document Reviewed: 01/28/2014 Elsevier Interactive Patient Education  2017 [employer].

## 2018-05-05 DIAGNOSIS — Z1329 Encounter for screening for other suspected endocrine disorder: Secondary | ICD-10-CM | POA: Diagnosis not present

## 2018-05-05 DIAGNOSIS — Z13220 Encounter for screening for lipoid disorders: Secondary | ICD-10-CM | POA: Diagnosis not present

## 2018-05-05 DIAGNOSIS — Z1321 Encounter for screening for nutritional disorder: Secondary | ICD-10-CM | POA: Diagnosis not present

## 2018-05-05 DIAGNOSIS — I10 Essential (primary) hypertension: Secondary | ICD-10-CM | POA: Diagnosis not present

## 2018-05-05 DIAGNOSIS — M810 Age-related osteoporosis without current pathological fracture: Secondary | ICD-10-CM | POA: Diagnosis not present

## 2018-05-06 ENCOUNTER — Ambulatory Visit (HOSPITAL_COMMUNITY)
Admission: RE | Admit: 2018-05-06 | Discharge: 2018-05-06 | Disposition: A | Discharge status: Home / Self Care | Payer: Medicare Other | Source: Ambulatory Visit | Attending: Cardiovascular Disease | Admitting: Cardiovascular Disease

## 2018-05-06 DIAGNOSIS — M79661 Pain in right lower leg: Secondary | ICD-10-CM | POA: Diagnosis not present

## 2018-05-06 DIAGNOSIS — F17200 Nicotine dependence, unspecified, uncomplicated: Secondary | ICD-10-CM | POA: Diagnosis not present

## 2018-05-06 DIAGNOSIS — I779 Disorder of arteries and arterioles, unspecified: Secondary | ICD-10-CM | POA: Diagnosis not present

## 2018-05-06 DIAGNOSIS — I10 Essential (primary) hypertension: Secondary | ICD-10-CM | POA: Insufficient documentation

## 2018-05-06 LAB — LIPID PANEL W/REFLEX DIRECT LDL
CHOL/HDL RATIO: 3.2 (calc) (ref <5.0)
CHOLESTEROL: 187 mg/dL (ref <200)
HDL: 59 mg/dL (ref >50)
LDL Cholesterol (Calc): 107 mg/dL (calc) — ABNORMAL HIGH
Non-HDL Cholesterol (Calc): 128 mg/dL (calc) (ref <130)
Triglycerides: 113 mg/dL (ref <150)

## 2018-05-06 LAB — COMPLETE METABOLIC PANEL WITH GFR
AG RATIO: 1.7 (calc) (ref 1.0–2.5)
ALT: 11 U/L (ref 6–29)
AST: 14 U/L (ref 10–35)
Albumin: 4 g/dL (ref 3.6–5.1)
Alkaline phosphatase (APISO): 88 U/L (ref 33–130)
BUN: 16 mg/dL (ref 7–25)
CALCIUM: 9.2 mg/dL (ref 8.6–10.4)
CO2: 28 mmol/L (ref 20–32)
CREATININE: 0.74 mg/dL (ref 0.50–0.99)
Chloride: 100 mmol/L (ref 98–110)
GFR, EST AFRICAN AMERICAN: 99 mL/min/{1.73_m2} (ref >=60)
GFR, EST NON AFRICAN AMERICAN: 85 mL/min/{1.73_m2} (ref >=60)
GLOBULIN: 2.4 g/dL (ref 1.9–3.7)
Glucose, Bld: 95 mg/dL (ref 65–99)
Potassium: 4.3 mmol/L (ref 3.5–5.3)
SODIUM: 136 mmol/L (ref 135–146)
TOTAL PROTEIN: 6.4 g/dL (ref 6.1–8.1)
Total Bilirubin: 0.6 mg/dL (ref 0.2–1.2)

## 2018-05-06 LAB — TSH: TSH: 4.24 mIU/L (ref 0.40–4.50)

## 2018-05-06 LAB — VITAMIN D 25 HYDROXY (VIT D DEFICIENCY, FRACTURES): VIT D 25 HYDROXY: 28 ng/mL — AB (ref 30–100)

## 2018-05-07 ENCOUNTER — Encounter: Payer: Self-pay | Admitting: Family Medicine

## 2018-05-07 DIAGNOSIS — I739 Peripheral vascular disease, unspecified: Secondary | ICD-10-CM

## 2018-05-08 ENCOUNTER — Encounter: Payer: Self-pay | Admitting: Family Medicine

## 2018-05-08 DIAGNOSIS — I739 Peripheral vascular disease, unspecified: Secondary | ICD-10-CM | POA: Insufficient documentation

## 2018-05-09 MED ORDER — ATORVASTATIN CALCIUM 40 MG PO TABS: 40.0000 mg | ORAL_TABLET | Freq: Every day | ORAL | 3 refills | Status: DC

## 2018-05-20 ENCOUNTER — Encounter: Payer: Self-pay | Admitting: Cardiovascular Disease

## 2018-05-20 ENCOUNTER — Ambulatory Visit (INDEPENDENT_AMBULATORY_CARE_PROVIDER_SITE_OTHER): Payer: Medicare Other | Admitting: Cardiovascular Disease

## 2018-05-20 VITALS — BP 100/60 | HR 89 | Ht 60.0 in | Wt 164.0 lb

## 2018-05-20 DIAGNOSIS — F17200 Nicotine dependence, unspecified, uncomplicated: Secondary | ICD-10-CM | POA: Diagnosis not present

## 2018-05-20 DIAGNOSIS — E7800 Pure hypercholesterolemia, unspecified: Secondary | ICD-10-CM

## 2018-05-20 DIAGNOSIS — E785 Hyperlipidemia, unspecified: Secondary | ICD-10-CM | POA: Insufficient documentation

## 2018-05-20 DIAGNOSIS — I739 Peripheral vascular disease, unspecified: Secondary | ICD-10-CM

## 2018-05-20 NOTE — Patient Instructions (Signed)
Medication Instructions:   NO CHANGE  Testing/Procedures:  Your physician has requested that you have a lower extremity arterial duplex. This test is an ultrasound of the arteries in the legs or arms. It looks at arterial blood flow in the legs and arms. Allow one hour for Lower and Upper Arterial scans. There are no restrictions or special instructions   Follow-Up:  CALL TO SCHEDULE PROCEDURE AT THE HOSPITAL 06/05/18 OR 06/09/18 OR 06/16/18

## 2018-05-20 NOTE — Assessment & Plan Note (Signed)
History of ongoing tobacco abuse of 1 pack/day for last 50 years with COPD recalcitrant to risk factor modification.

## 2018-05-20 NOTE — Progress Notes (Signed)
05/20/2018 Brenda AlbrightGloria Evans   August 04, 1951  [id number]  Primary Physician Agapito GamesMetheney, Catherine D, MD Primary Cardiologist: Runell GessJonathan J Eleni Frank MD Milagros LollFACP, FACC, [place], [place]  HPI:  Brenda AlbrightGloria Evans is a 67 y.o. moderately overweight divorced Caucasian female with no children referred by Dr. Clementeen GrahamEvan Corey for peripheral vascular evaluation because of symptomatic right lower extremity claudication.  She worked as an Airline pilotaccountant prior to retiring.  She smoked 50 pack years and continues to smoke 1 pack/day and has treated hypertension hyperlipidemia.  She is never had a heart attack or stroke denies chest pain or shortness of breath.  She is complained of right greater than left lower extremity calf claudication less than 1 block for the last several weeks with recent segmental pressures performed 05/09/2018 revealing a right ABI 0.5 in the left 0.91.   Current Meds  Medication Sig  . albuterol (PROAIR HFA) 108 (90 Base) MCG/ACT inhaler Inhale 2 puffs into the lungs every 6 (six) hours as needed for wheezing or shortness of breath.  Marland Kitchen. aspirin 81 MG tablet Take 81 mg by mouth daily.    Marland Kitchen. atorvastatin (LIPITOR) 40 MG tablet Take 1 tablet (40 mg total) by mouth daily.  Marland Kitchen. b complex vitamins tablet Take 1 tablet by mouth daily.  . diclofenac sodium (VOLTAREN) 1 % GEL Apply 4 g topically 4 (four) times daily. To affected joint.  . hydrochlorothiazide (MICROZIDE) 12.5 MG capsule Take 1 capsule (12.5 mg total) by mouth daily. Return for nurse visit BP check in 2 weeks.  Marland Kitchen. losartan (COZAAR) 100 MG tablet Take 1 tablet (100 mg total) by mouth daily. Due for follow up visit  . umeclidinium bromide (INCRUSE ELLIPTA) 62.5 MCG/INH AEPB Inhale 1 puff into the lungs daily.  . vitamin D, CHOLECALCIFEROL, 400 UNITS tablet Take 400 Units by mouth daily.       Allergies  Allergen Reactions  . Allegra [Fexofenadine Hcl] Other (See Comments)    Muscle pain.   Marland Kitchen. Fluticasone Other (See Comments)    Nasal sores    Social History     Socioeconomic History  . Marital status: Divorced    Spouse name: Not on file  . Number of children: Not on file  . Years of education: Not on file  . Highest education level: Not on file  Occupational History  . Not on file  Social Needs  . Financial resource strain: Not on file  . Food insecurity:    Worry: Not on file    Inability: Not on file  . Transportation needs:    Medical: Not on file    Non-medical: Not on file  Tobacco Use  . Smoking status: Current Every Day Smoker    Packs/day: 1.00    Years: 50.00    Pack years: 50.00  . Smokeless tobacco: Never Used  Substance and Sexual Activity  . Alcohol use: Yes  . Drug use: No  . Sexual activity: Not on file  Lifestyle  . Physical activity:    Days per week: Not on file    Minutes per session: Not on file  . Stress: Not on file  Relationships  . Social connections:    Talks on phone: Not on file    Gets together: Not on file    Attends religious service: Not on file    Active member of club or organization: Not on file    Attends meetings of clubs or organizations: Not on file    Relationship status: Not on file  .  Intimate partner violence:    Fear of current or ex partner: Not on file    Emotionally abused: Not on file    Physically abused: Not on file    Forced sexual activity: Not on file  Other Topics Concern  . Not on file  Social History Narrative  . Not on file     Review of Systems: General: negative for chills, fever, night sweats or weight changes.  Cardiovascular: negative for chest pain, dyspnea on exertion, edema, orthopnea, palpitations, paroxysmal nocturnal dyspnea or shortness of breath Dermatological: negative for rash Respiratory: negative for cough or wheezing Urologic: negative for hematuria Abdominal: negative for nausea, vomiting, diarrhea, bright red blood per rectum, melena, or hematemesis Neurologic: negative for visual changes, syncope, or dizziness All other systems  reviewed and are otherwise negative except as noted above.    Blood pressure 100/60, pulse 89, height 5' (1.524 m), weight 164 lb (74.4 kg).  General appearance: alert and no distress Neck: no adenopathy, no carotid bruit, no JVD, supple, symmetrical, trachea midline and thyroid not enlarged, symmetric, no tenderness/mass/nodules Lungs: clear to auscultation bilaterally Heart: regular rate and rhythm, S1, S2 normal, no murmur, click, rub or gallop Extremities: extremities normal, atraumatic, no cyanosis or edema Pulses: 2+ and symmetric Skin: Skin color, texture, turgor normal. No rashes or lesions Neurologic: Alert and oriented X 3, normal strength and tone. Normal symmetric reflexes. Normal coordination and gait  EKG sinus rhythm 89 without ST or T wave changes.  I personally reviewed this EKG.  ASSESSMENT AND PLAN:   PAD (peripheral artery disease) (HCC) Ms. Brenda Evans was referred to me by Dr. Clementeen Graham, sports medicine physician, for evaluation treatment of symptomatic PAD.  She is had right lower extremity claudication for the last several weeks.  Segmental pressures performed 05/06/2018 revealed a right ABI of 0.5 and a left of 0.91.  She had monophasic popliteal waveforms.  I am going to get lower extremity arterial Doppler studies and arrange for her to undergo angiography and potential endovascular therapy by myself sometime in the next several weeks.  HYPERTENSION, BENIGN ESSENTIAL History of essential hypertension with blood pressure measured today at 100/60.  She is on losartan and hydrochlorothiazide.  Hyperlipidemia History of hyperlipidemia on statin therapy  TOBACCO ABUSE History of ongoing tobacco abuse of 1 pack/day for last 50 years with COPD recalcitrant to risk factor modification.      Runell Gess MD FACP,FACC,FAHA, [facility] 05/20/2018 3:01 PM

## 2018-05-20 NOTE — H&P (View-Only) (Signed)
   05/20/2018 Brenda Evans   09/25/1951  [id number]  Primary Physician Metheney, Catherine D, MD Primary Cardiologist: Braidan Ricciardi J Christy Ehrsam MD FACP, FACC, FAHA, FSCAI  HPI:  Brenda Evans is a 67 y.o. moderately overweight divorced Caucasian female with no children referred by Dr. Evan Corey for peripheral vascular evaluation because of symptomatic right lower extremity claudication.  She worked as an accountant prior to retiring.  She smoked 50 pack years and continues to smoke 1 pack/day and has treated hypertension hyperlipidemia.  She is never had a heart attack or stroke denies chest pain or shortness of breath.  She is complained of right greater than left lower extremity calf claudication less than 1 block for the last several weeks with recent segmental pressures performed 05/09/2018 revealing a right ABI 0.5 in the left 0.91.   Current Meds  Medication Sig  . albuterol (PROAIR HFA) 108 (90 Base) MCG/ACT inhaler Inhale 2 puffs into the lungs every 6 (six) hours as needed for wheezing or shortness of breath.  . aspirin 81 MG tablet Take 81 mg by mouth daily.    . atorvastatin (LIPITOR) 40 MG tablet Take 1 tablet (40 mg total) by mouth daily.  . b complex vitamins tablet Take 1 tablet by mouth daily.  . diclofenac sodium (VOLTAREN) 1 % GEL Apply 4 g topically 4 (four) times daily. To affected joint.  . hydrochlorothiazide (MICROZIDE) 12.5 MG capsule Take 1 capsule (12.5 mg total) by mouth daily. Return for nurse visit BP check in 2 weeks.  . losartan (COZAAR) 100 MG tablet Take 1 tablet (100 mg total) by mouth daily. Due for follow up visit  . umeclidinium bromide (INCRUSE ELLIPTA) 62.5 MCG/INH AEPB Inhale 1 puff into the lungs daily.  . vitamin D, CHOLECALCIFEROL, 400 UNITS tablet Take 400 Units by mouth daily.       Allergies  Allergen Reactions  . Allegra [Fexofenadine Hcl] Other (See Comments)    Muscle pain.   . Fluticasone Other (See Comments)    Nasal sores    Social History     Socioeconomic History  . Marital status: Divorced    Spouse name: Not on file  . Number of children: Not on file  . Years of education: Not on file  . Highest education level: Not on file  Occupational History  . Not on file  Social Needs  . Financial resource strain: Not on file  . Food insecurity:    Worry: Not on file    Inability: Not on file  . Transportation needs:    Medical: Not on file    Non-medical: Not on file  Tobacco Use  . Smoking status: Current Every Day Smoker    Packs/day: 1.00    Years: 50.00    Pack years: 50.00  . Smokeless tobacco: Never Used  Substance and Sexual Activity  . Alcohol use: Yes  . Drug use: No  . Sexual activity: Not on file  Lifestyle  . Physical activity:    Days per week: Not on file    Minutes per session: Not on file  . Stress: Not on file  Relationships  . Social connections:    Talks on phone: Not on file    Gets together: Not on file    Attends religious service: Not on file    Active member of club or organization: Not on file    Attends meetings of clubs or organizations: Not on file    Relationship status: Not on file  .   Intimate partner violence:    Fear of current or ex partner: Not on file    Emotionally abused: Not on file    Physically abused: Not on file    Forced sexual activity: Not on file  Other Topics Concern  . Not on file  Social History Narrative  . Not on file     Review of Systems: General: negative for chills, fever, night sweats or weight changes.  Cardiovascular: negative for chest pain, dyspnea on exertion, edema, orthopnea, palpitations, paroxysmal nocturnal dyspnea or shortness of breath Dermatological: negative for rash Respiratory: negative for cough or wheezing Urologic: negative for hematuria Abdominal: negative for nausea, vomiting, diarrhea, bright red blood per rectum, melena, or hematemesis Neurologic: negative for visual changes, syncope, or dizziness All other systems  reviewed and are otherwise negative except as noted above.    Blood pressure 100/60, pulse 89, height 5' (1.524 m), weight 164 lb (74.4 kg).  General appearance: alert and no distress Neck: no adenopathy, no carotid bruit, no JVD, supple, symmetrical, trachea midline and thyroid not enlarged, symmetric, no tenderness/mass/nodules Lungs: clear to auscultation bilaterally Heart: regular rate and rhythm, S1, S2 normal, no murmur, click, rub or gallop Extremities: extremities normal, atraumatic, no cyanosis or edema Pulses: 2+ and symmetric Skin: Skin color, texture, turgor normal. No rashes or lesions Neurologic: Alert and oriented X 3, normal strength and tone. Normal symmetric reflexes. Normal coordination and gait  EKG sinus rhythm 89 without ST or T wave changes.  I personally reviewed this EKG.  ASSESSMENT AND PLAN:   PAD (peripheral artery disease) (HCC) Ms. Ruedas was referred to me by Dr. Evan Corey, sports medicine physician, for evaluation treatment of symptomatic PAD.  She is had right lower extremity claudication for the last several weeks.  Segmental pressures performed 05/06/2018 revealed a right ABI of 0.5 and a left of 0.91.  She had monophasic popliteal waveforms.  I am going to get lower extremity arterial Doppler studies and arrange for her to undergo angiography and potential endovascular therapy by myself sometime in the next several weeks.  HYPERTENSION, BENIGN ESSENTIAL History of essential hypertension with blood pressure measured today at 100/60.  She is on losartan and hydrochlorothiazide.  Hyperlipidemia History of hyperlipidemia on statin therapy  TOBACCO ABUSE History of ongoing tobacco abuse of 1 pack/day for last 50 years with COPD recalcitrant to risk factor modification.      Rock Sobol J. Leslee Suire MD FACP,FACC,FAHA, FSCAI 05/20/2018 3:01 PM 

## 2018-05-20 NOTE — Assessment & Plan Note (Signed)
Ms. Brenda Evans was referred to me by Dr. Clementeen GrahamEvan Corey, sports medicine physician, for evaluation treatment of symptomatic PAD.  She is had right lower extremity claudication for the last several weeks.  Segmental pressures performed 05/06/2018 revealed a right ABI of 0.5 and a left of 0.91.  She had monophasic popliteal waveforms.  I am going to get lower extremity arterial Doppler studies and arrange for her to undergo angiography and potential endovascular therapy by myself sometime in the next several weeks.

## 2018-05-20 NOTE — Assessment & Plan Note (Signed)
History of hyperlipidemia on statin therapy. 

## 2018-05-20 NOTE — Assessment & Plan Note (Signed)
History of essential hypertension with blood pressure measured today at 100/60.  She is on losartan and hydrochlorothiazide.

## 2018-05-21 ENCOUNTER — Other Ambulatory Visit: Payer: Self-pay | Admitting: Cardiovascular Disease

## 2018-05-21 ENCOUNTER — Encounter: Payer: Self-pay | Admitting: Family Medicine

## 2018-05-21 DIAGNOSIS — I739 Peripheral vascular disease, unspecified: Secondary | ICD-10-CM

## 2018-05-22 ENCOUNTER — Telehealth: Payer: Self-pay | Admitting: Cardiovascular Disease

## 2018-05-22 DIAGNOSIS — I739 Peripheral vascular disease, unspecified: Secondary | ICD-10-CM

## 2018-05-22 DIAGNOSIS — Z01812 Encounter for preprocedural laboratory examination: Secondary | ICD-10-CM

## 2018-05-22 DIAGNOSIS — I10 Essential (primary) hypertension: Secondary | ICD-10-CM

## 2018-05-22 NOTE — Telephone Encounter (Signed)
New Message    [facility] states that she is to schedule a procedure at the hospital. Please call.

## 2018-05-22 NOTE — Telephone Encounter (Signed)
Pt scheduled with Dr. Allyson SabalBerry for PV angiogram on 8/12 at 7:30 am. Pt pre-procedure labs ordered, chest xray, and letter printed for pt to pick up with instructions.   Pt will pick up procedure information from [facility] front desk on 7/23 when she arrives for her dopplers.  Pt made aware reviewed instructions on phone with pt f/u appt scheduled post procedure.

## 2018-05-22 NOTE — Telephone Encounter (Addendum)
Per Dr. Allyson SabalBerry on 05/20/18 " Ms. Brenda Evans was referred to me by Dr. Clementeen GrahamEvan Corey, sports medicine physician, for evaluation treatment of symptomatic PAD.  She is had right lower extremity claudication for the last several weeks.  Segmental pressures performed 05/06/2018 revealed a right ABI of 0.5 and a left of 0.91.  She had monophasic popliteal waveforms.  I am going to get lower extremity arterial Doppler studies and arrange for her to undergo angiography and potential endovascular therapy by myself sometime in the next several weeks."   Here are the dates given to patient. 06/05/18 OR 06/09/18 OR 06/16/18.

## 2018-05-23 ENCOUNTER — Encounter: Payer: Self-pay | Admitting: Cardiovascular Disease

## 2018-05-27 ENCOUNTER — Ambulatory Visit (HOSPITAL_COMMUNITY)
Admission: RE | Admit: 2018-05-27 | Discharge: 2018-05-27 | Disposition: A | Discharge status: Home / Self Care | Payer: Medicare Other | Source: Ambulatory Visit | Attending: Internal Medicine | Admitting: Internal Medicine

## 2018-05-27 DIAGNOSIS — I739 Peripheral vascular disease, unspecified: Secondary | ICD-10-CM | POA: Diagnosis not present

## 2018-05-30 ENCOUNTER — Other Ambulatory Visit: Payer: Self-pay | Admitting: Family Medicine

## 2018-05-30 DIAGNOSIS — I10 Essential (primary) hypertension: Secondary | ICD-10-CM

## 2018-06-02 ENCOUNTER — Ambulatory Visit (INDEPENDENT_AMBULATORY_CARE_PROVIDER_SITE_OTHER): Payer: Medicare Other

## 2018-06-02 DIAGNOSIS — Z01818 Encounter for other preprocedural examination: Secondary | ICD-10-CM

## 2018-06-02 DIAGNOSIS — I739 Peripheral vascular disease, unspecified: Secondary | ICD-10-CM

## 2018-06-02 DIAGNOSIS — Z01812 Encounter for preprocedural laboratory examination: Secondary | ICD-10-CM

## 2018-06-02 DIAGNOSIS — I10 Essential (primary) hypertension: Secondary | ICD-10-CM | POA: Diagnosis not present

## 2018-06-05 ENCOUNTER — Ambulatory Visit: Payer: Medicare Other | Admitting: Family Medicine

## 2018-06-16 ENCOUNTER — Ambulatory Visit (HOSPITAL_COMMUNITY)
Admission: RE | Admit: 2018-06-16 | Discharge: 2018-06-17 | Disposition: A | Discharge status: Home / Self Care | Payer: Medicare Other | Source: Ambulatory Visit | Attending: Cardiovascular Disease | Admitting: Cardiovascular Disease

## 2018-06-16 ENCOUNTER — Encounter (HOSPITAL_COMMUNITY): Payer: Self-pay | Admitting: Cardiovascular Disease

## 2018-06-16 ENCOUNTER — Other Ambulatory Visit: Payer: Self-pay

## 2018-06-16 ENCOUNTER — Encounter (HOSPITAL_COMMUNITY)
Admission: RE | Disposition: A | Discharge status: Home / Self Care | Payer: Self-pay | Source: Ambulatory Visit | Attending: Cardiovascular Disease

## 2018-06-16 DIAGNOSIS — F17200 Nicotine dependence, unspecified, uncomplicated: Secondary | ICD-10-CM | POA: Diagnosis present

## 2018-06-16 DIAGNOSIS — F17210 Nicotine dependence, cigarettes, uncomplicated: Secondary | ICD-10-CM | POA: Insufficient documentation

## 2018-06-16 DIAGNOSIS — I7092 Chronic total occlusion of artery of the extremities: Secondary | ICD-10-CM | POA: Diagnosis not present

## 2018-06-16 DIAGNOSIS — J449 Chronic obstructive pulmonary disease, unspecified: Secondary | ICD-10-CM | POA: Diagnosis present

## 2018-06-16 DIAGNOSIS — I70211 Atherosclerosis of native arteries of extremities with intermittent claudication, right leg: Secondary | ICD-10-CM | POA: Diagnosis not present

## 2018-06-16 DIAGNOSIS — Z7982 Long term (current) use of aspirin: Secondary | ICD-10-CM | POA: Insufficient documentation

## 2018-06-16 DIAGNOSIS — I739 Peripheral vascular disease, unspecified: Secondary | ICD-10-CM | POA: Diagnosis present

## 2018-06-16 DIAGNOSIS — E785 Hyperlipidemia, unspecified: Secondary | ICD-10-CM | POA: Diagnosis not present

## 2018-06-16 DIAGNOSIS — I10 Essential (primary) hypertension: Secondary | ICD-10-CM | POA: Diagnosis present

## 2018-06-16 HISTORY — PX: ABDOMINAL AORTOGRAM W/LOWER EXTREMITY: CATH118223

## 2018-06-16 HISTORY — DX: Chronic obstructive pulmonary disease, unspecified: J44.9

## 2018-06-16 HISTORY — DX: Peripheral vascular disease, unspecified: I73.9

## 2018-06-16 HISTORY — PX: PERIPHERAL VASCULAR ATHERECTOMY: CATH118256

## 2018-06-16 HISTORY — DX: Pure hypercholesterolemia, unspecified: E78.00

## 2018-06-16 HISTORY — DX: Unspecified osteoarthritis, unspecified site: M19.90

## 2018-06-16 LAB — CBC
HCT: 40.3 % (ref 36.0–46.0)
Hemoglobin: 12.8 g/dL (ref 12.0–15.0)
MCH: 32.1 pg (ref 26.0–34.0)
MCHC: 31.8 g/dL (ref 30.0–36.0)
MCV: 101 fL — ABNORMAL HIGH (ref 78.0–100.0)
PLATELETS: 140 10*3/uL — AB (ref 150–400)
RBC: 3.99 MIL/uL (ref 3.87–5.11)
RDW: 15.6 % — ABNORMAL HIGH (ref 11.5–15.5)
WBC: 6.6 10*3/uL (ref 4.0–10.5)

## 2018-06-16 LAB — BASIC METABOLIC PANEL
Anion gap: 8 (ref 5–15)
BUN: 12 mg/dL (ref 8–23)
CALCIUM: 8.8 mg/dL — AB (ref 8.9–10.3)
CHLORIDE: 102 mmol/L (ref 98–111)
CO2: 28 mmol/L (ref 22–32)
Creatinine, Ser: 0.72 mg/dL (ref 0.44–1.00)
GFR calc Af Amer: >60 mL/min (ref >60)
GFR calc non Af Amer: >60 mL/min (ref >60)
Glucose, Bld: 98 mg/dL (ref 70–99)
Potassium: 4.6 mmol/L (ref 3.5–5.1)
SODIUM: 138 mmol/L (ref 135–145)

## 2018-06-16 LAB — POCT ACTIVATED CLOTTING TIME
Activated Clotting Time: 191 seconds
Activated Clotting Time: 158 seconds

## 2018-06-16 SURGERY — ABDOMINAL AORTOGRAM W/LOWER EXTREMITY
Anesthesia: LOCAL | Laterality: Right

## 2018-06-16 MED ORDER — CLOPIDOGREL BISULFATE 75 MG PO TABS
75.0000 mg | ORAL_TABLET | Freq: Every day | ORAL | Status: DC
  Administered 2018-06-17: 10:00:00 75 mg via ORAL
  Filled 2018-06-16: qty 1

## 2018-06-16 MED ORDER — ASPIRIN 81 MG PO CHEW
CHEWABLE_TABLET | ORAL | Status: AC
  Filled 2018-06-16: qty 1

## 2018-06-16 MED ORDER — SODIUM CHLORIDE 0.9 % IV SOLN: 250.0000 mL | INTRAVENOUS | Status: DC | PRN

## 2018-06-16 MED ORDER — LIDOCAINE HCL (PF) 1 % IJ SOLN
INTRAMUSCULAR | Status: AC
  Filled 2018-06-16: qty 30

## 2018-06-16 MED ORDER — HYDROCHLOROTHIAZIDE 12.5 MG PO CAPS
12.5000 mg | ORAL_CAPSULE | Freq: Every day | ORAL | Status: DC
  Administered 2018-06-16 – 2018-06-17 (×2): 12.5 mg via ORAL
  Filled 2018-06-16 (×2): qty 1

## 2018-06-16 MED ORDER — ASPIRIN 81 MG PO CHEW
81.0000 mg | CHEWABLE_TABLET | ORAL | Status: AC
  Administered 2018-06-16: 81 mg via ORAL

## 2018-06-16 MED ORDER — HEPARIN (PORCINE) IN NACL 1000-0.9 UT/500ML-% IV SOLN
INTRAVENOUS | Status: AC
  Filled 2018-06-16: qty 1000

## 2018-06-16 MED ORDER — MORPHINE SULFATE (PF) 2 MG/ML IV SOLN: 2.0000 mg | INTRAVENOUS | Status: DC | PRN

## 2018-06-16 MED ORDER — SODIUM CHLORIDE 0.9% FLUSH: 3.0000 mL | INTRAVENOUS | Status: DC | PRN

## 2018-06-16 MED ORDER — SODIUM CHLORIDE 0.9 % WEIGHT BASED INFUSION: 1.0000 mL/kg/h | INTRAVENOUS | Status: DC

## 2018-06-16 MED ORDER — UMECLIDINIUM BROMIDE 62.5 MCG/INH IN AEPB
1.0000 | INHALATION_SPRAY | Freq: Every day | RESPIRATORY_TRACT | Status: DC
Start: 2018-06-16 — End: 2018-06-17
  Administered 2018-06-17: 1 via RESPIRATORY_TRACT
  Filled 2018-06-16: qty 7

## 2018-06-16 MED ORDER — LABETALOL HCL 5 MG/ML IV SOLN: 10.0000 mg | INTRAVENOUS | Status: DC | PRN

## 2018-06-16 MED ORDER — LOSARTAN POTASSIUM 50 MG PO TABS
100.0000 mg | ORAL_TABLET | Freq: Every day | ORAL | Status: DC
  Administered 2018-06-16 – 2018-06-17 (×2): 100 mg via ORAL
  Filled 2018-06-16 (×2): qty 2

## 2018-06-16 MED ORDER — SODIUM CHLORIDE 0.9 % WEIGHT BASED INFUSION
3.0000 mL/kg/h | INTRAVENOUS | Status: DC
  Administered 2018-06-16: 3 mL/kg/h via INTRAVENOUS

## 2018-06-16 MED ORDER — HEPARIN (PORCINE) IN NACL 1000-0.9 UT/500ML-% IV SOLN
INTRAVENOUS | Status: DC | PRN
  Administered 2018-06-16: 500 mL

## 2018-06-16 MED ORDER — SODIUM CHLORIDE 0.9 % IV SOLN
INTRAVENOUS | Status: AC
  Administered 2018-06-16: 10:00:00 via INTRAVENOUS

## 2018-06-16 MED ORDER — CLOPIDOGREL BISULFATE 300 MG PO TABS
ORAL_TABLET | ORAL | Status: DC | PRN
  Administered 2018-06-16: 300 mg via ORAL

## 2018-06-16 MED ORDER — MORPHINE SULFATE (PF) 10 MG/ML IV SOLN: 2.0000 mg | INTRAVENOUS | Status: DC | PRN

## 2018-06-16 MED ORDER — ACETAMINOPHEN 325 MG PO TABS: 650.0000 mg | ORAL_TABLET | ORAL | Status: DC | PRN

## 2018-06-16 MED ORDER — FENTANYL CITRATE (PF) 100 MCG/2ML IJ SOLN
INTRAMUSCULAR | Status: DC | PRN
  Administered 2018-06-16 (×2): 25 ug via INTRAVENOUS

## 2018-06-16 MED ORDER — CLOPIDOGREL BISULFATE 300 MG PO TABS
ORAL_TABLET | ORAL | Status: AC
  Filled 2018-06-16: qty 1

## 2018-06-16 MED ORDER — ASPIRIN 81 MG PO CHEW
81.0000 mg | CHEWABLE_TABLET | Freq: Every day | ORAL | Status: DC
  Administered 2018-06-16 – 2018-06-17 (×2): 81 mg via ORAL
  Filled 2018-06-16 (×2): qty 1

## 2018-06-16 MED ORDER — ASPIRIN EC 81 MG PO TBEC: 81.0000 mg | DELAYED_RELEASE_TABLET | Freq: Every day | ORAL | Status: DC

## 2018-06-16 MED ORDER — SODIUM CHLORIDE 0.9% FLUSH: 3.0000 mL | Freq: Two times a day (BID) | INTRAVENOUS | Status: DC

## 2018-06-16 MED ORDER — SODIUM CHLORIDE 0.9% FLUSH
3.0000 mL | Freq: Two times a day (BID) | INTRAVENOUS | Status: DC
  Administered 2018-06-16: 3 mL via INTRAVENOUS

## 2018-06-16 MED ORDER — ATORVASTATIN CALCIUM 40 MG PO TABS
40.0000 mg | ORAL_TABLET | Freq: Every day | ORAL | Status: DC
  Administered 2018-06-16 – 2018-06-17 (×2): 40 mg via ORAL
  Filled 2018-06-16 (×2): qty 1

## 2018-06-16 MED ORDER — ONDANSETRON HCL 4 MG/2ML IJ SOLN: 4.0000 mg | Freq: Four times a day (QID) | INTRAMUSCULAR | Status: DC | PRN

## 2018-06-16 MED ORDER — IODIXANOL 320 MG/ML IV SOLN
INTRAVENOUS | Status: DC | PRN
  Administered 2018-06-16: 180 mL via INTRA_ARTERIAL

## 2018-06-16 MED ORDER — ANGIOPLASTY BOOK
Freq: Once | Status: AC
  Administered 2018-06-16: 22:00:00
  Filled 2018-06-16: qty 1

## 2018-06-16 MED ORDER — MIDAZOLAM HCL 2 MG/2ML IJ SOLN
INTRAMUSCULAR | Status: DC | PRN
  Administered 2018-06-16 (×2): 1 mg via INTRAVENOUS

## 2018-06-16 MED ORDER — HEPARIN SODIUM (PORCINE) 1000 UNIT/ML IJ SOLN
INTRAMUSCULAR | Status: AC
  Filled 2018-06-16: qty 1

## 2018-06-16 MED ORDER — MIDAZOLAM HCL 2 MG/2ML IJ SOLN
INTRAMUSCULAR | Status: AC
  Filled 2018-06-16: qty 2

## 2018-06-16 MED ORDER — LIDOCAINE HCL (PF) 1 % IJ SOLN
INTRAMUSCULAR | Status: DC | PRN
  Administered 2018-06-16: 30 mL

## 2018-06-16 MED ORDER — NICOTINE 21 MG/24HR TD PT24
21.0000 mg | MEDICATED_PATCH | Freq: Every day | TRANSDERMAL | Status: DC
  Administered 2018-06-16 – 2018-06-17 (×2): 21 mg via TRANSDERMAL
  Filled 2018-06-16 (×2): qty 1

## 2018-06-16 MED ORDER — FENTANYL CITRATE (PF) 100 MCG/2ML IJ SOLN
INTRAMUSCULAR | Status: AC
  Filled 2018-06-16: qty 2

## 2018-06-16 MED ORDER — ALBUTEROL SULFATE (2.5 MG/3ML) 0.083% IN NEBU: 3.0000 mL | INHALATION_SOLUTION | Freq: Four times a day (QID) | RESPIRATORY_TRACT | Status: DC | PRN

## 2018-06-16 MED ORDER — HEPARIN SODIUM (PORCINE) 1000 UNIT/ML IJ SOLN
INTRAMUSCULAR | Status: DC | PRN
  Administered 2018-06-16: 8000 [IU] via INTRAVENOUS

## 2018-06-16 MED ORDER — HYDRALAZINE HCL 20 MG/ML IJ SOLN: 5.0000 mg | INTRAMUSCULAR | Status: DC | PRN

## 2018-06-16 SURGICAL SUPPLY — 32 items
BAG SNAP BAND KOVER 36X36 (MISCELLANEOUS) ×1 IMPLANT
BALLN COYOTE OTW 2.5X100X150 (BALLOONS) ×3
BALLN IN.PACT DCB 5X150 (BALLOONS) ×3
BALLOON COYOTE OTW 2.5X100X150 (BALLOONS) IMPLANT
CATH ANGIO 5F PIGTAIL 65CM (CATHETERS) ×1 IMPLANT
CATH CROSS OVER TEMPO 5F (CATHETERS) ×1 IMPLANT
CATH HAWKONE LX EXTENDED TIP (CATHETERS) ×1 IMPLANT
CATH VIANCE CROSS STAND 150CM (MICROCATHETER) ×3
CATH VIANCE CROSS STD 150CM (MICROCATHETER) IMPLANT
DCB IN.PACT 5X150 (BALLOONS) IMPLANT
DEVICE CONTINUOUS FLUSH (MISCELLANEOUS) ×1 IMPLANT
DEVICE SPIDERFX EMB PROT 6MM (WIRE) ×1 IMPLANT
DEVICE TORQUE .014-.018 (MISCELLANEOUS) IMPLANT
GUIDEWIRE ANGLED .035X150CM (WIRE) ×1 IMPLANT
KIT ENCORE 26 ADVANTAGE (KITS) ×1 IMPLANT
KIT PV (KITS) ×3 IMPLANT
NDL SMART REG 18GX2-3/4 (NEEDLE) IMPLANT
NEEDLE SMART REG 18GX2-3/4 (NEEDLE) ×3 IMPLANT
SHEATH HIGHFLEX ANSEL 7FR 55CM (SHEATH) ×1 IMPLANT
SHEATH PINNACLE 5F 10CM (SHEATH) ×1 IMPLANT
SHEATH PINNACLE 7F 10CM (SHEATH) ×1 IMPLANT
STOPCOCK MORSE 400PSI 3WAY (MISCELLANEOUS) ×1 IMPLANT
SYRINGE MEDRAD AVANTA MACH 7 (SYRINGE) ×1 IMPLANT
TAPE VIPERTRACK RADIOPAQ (MISCELLANEOUS) IMPLANT
TAPE VIPERTRACK RADIOPAQUE (MISCELLANEOUS) ×3
TORQUE DEVICE .014-.018 (MISCELLANEOUS) ×3
TRANSDUCER W/STOPCOCK (MISCELLANEOUS) ×3 IMPLANT
TRAY PV CATH (CUSTOM PROCEDURE TRAY) ×3 IMPLANT
TUBING CIL FLEX 10 FLL-RA (TUBING) ×1 IMPLANT
WIRE HITORQ VERSACORE ST 145CM (WIRE) ×1 IMPLANT
WIRE ROSEN-J .035X180CM (WIRE) ×1 IMPLANT
WIRE SPARTACORE .014X300CM (WIRE) ×1 IMPLANT

## 2018-06-16 NOTE — Progress Notes (Signed)
Patient C/O feeling weak and dizzy.  BP - 43/26 right arm.  BP repeated on left arm - 49/28.  Left groin hematoma is unchanged.  Fluid bolus initiated,  MD text paged.

## 2018-06-16 NOTE — Progress Notes (Signed)
Site area: right groin  Site Prior to Removal:  Level 1  Pressure Applied For 30 MINUTES    Minutes Beginning at 1220  Manual:   Yes.    Patient Status During Pull:  Stable  Post Pull Groin Site:  Level 1  Post Pull Instructions Given:  Yes.    Post Pull Pulses Present:  Yes.    Dressing Applied:  Yes.    Comments:  Hematoma present before and after sheath pull. Site Stable.

## 2018-06-16 NOTE — Progress Notes (Signed)
PA present in room.  BP improved after a 300 cc fluid bolus - 102/49.  Ordered IV rate resumed. Will continue to monitor.

## 2018-06-16 NOTE — Progress Notes (Addendum)
Temporary drop in SBP down to the 50s, quickly improved with IV hydration. Denies significant groin pain. Some back pain, but not severe. Groin has a hematoma, but no bruit. Recommend continue observation, if SBP dip again, will order CT of abdomen and hip. She is currently lying comfortably in bedside chair. No acute stress  Ramond DialSigned, Brittnee Gaetano PA Pager: [phone]

## 2018-06-16 NOTE — Interval H&P Note (Signed)
History and Physical Interval Note:  06/16/2018 8:15 AM  Brenda Evans  has presented today for surgery, with the diagnosis of pvd  The various methods of treatment have been discussed with the patient and family. After consideration of risks, benefits and other options for treatment, the patient has consented to  Procedure(s): ABDOMINAL AORTOGRAM W/LOWER EXTREMITY (N/A) as a surgical intervention .  The patient's history has been reviewed, patient examined, no change in status, stable for surgery.  I have reviewed the patient's chart and labs.  Questions were answered to the patient's satisfaction.     Nanetta BattyJonathan Ymani Porcher

## 2018-06-17 ENCOUNTER — Other Ambulatory Visit: Payer: Self-pay | Admitting: Physician Assistant

## 2018-06-17 ENCOUNTER — Other Ambulatory Visit: Payer: Self-pay | Admitting: Cardiovascular Disease

## 2018-06-17 DIAGNOSIS — I10 Essential (primary) hypertension: Secondary | ICD-10-CM | POA: Diagnosis not present

## 2018-06-17 DIAGNOSIS — Z9862 Peripheral vascular angioplasty status: Secondary | ICD-10-CM

## 2018-06-17 DIAGNOSIS — I739 Peripheral vascular disease, unspecified: Secondary | ICD-10-CM

## 2018-06-17 DIAGNOSIS — I70211 Atherosclerosis of native arteries of extremities with intermittent claudication, right leg: Secondary | ICD-10-CM | POA: Diagnosis not present

## 2018-06-17 DIAGNOSIS — J449 Chronic obstructive pulmonary disease, unspecified: Secondary | ICD-10-CM | POA: Diagnosis not present

## 2018-06-17 DIAGNOSIS — I7092 Chronic total occlusion of artery of the extremities: Secondary | ICD-10-CM | POA: Diagnosis not present

## 2018-06-17 DIAGNOSIS — F17210 Nicotine dependence, cigarettes, uncomplicated: Secondary | ICD-10-CM | POA: Diagnosis not present

## 2018-06-17 DIAGNOSIS — E785 Hyperlipidemia, unspecified: Secondary | ICD-10-CM | POA: Diagnosis not present

## 2018-06-17 LAB — BASIC METABOLIC PANEL
Anion gap: 8 (ref 5–15)
BUN: 9 mg/dL (ref 8–23)
CALCIUM: 8.2 mg/dL — AB (ref 8.9–10.3)
CHLORIDE: 104 mmol/L (ref 98–111)
CO2: 25 mmol/L (ref 22–32)
CREATININE: 0.72 mg/dL (ref 0.44–1.00)
GFR calc Af Amer: >60 mL/min (ref >60)
GFR calc non Af Amer: >60 mL/min (ref >60)
GLUCOSE: 104 mg/dL — AB (ref 70–99)
Potassium: 4.1 mmol/L (ref 3.5–5.1)
Sodium: 137 mmol/L (ref 135–145)

## 2018-06-17 LAB — CBC
HCT: 33.1 % — ABNORMAL LOW (ref 36.0–46.0)
Hemoglobin: 10.5 g/dL — ABNORMAL LOW (ref 12.0–15.0)
MCH: 32 pg (ref 26.0–34.0)
MCHC: 31.7 g/dL (ref 30.0–36.0)
MCV: 100.9 fL — AB (ref 78.0–100.0)
PLATELETS: 136 10*3/uL — AB (ref 150–400)
RBC: 3.28 MIL/uL — ABNORMAL LOW (ref 3.87–5.11)
RDW: 15.6 % — ABNORMAL HIGH (ref 11.5–15.5)
WBC: 7.6 10*3/uL (ref 4.0–10.5)

## 2018-06-17 MED ORDER — CLOPIDOGREL BISULFATE 75 MG PO TABS: 75.0000 mg | ORAL_TABLET | Freq: Every day | ORAL | 3 refills | Status: AC

## 2018-06-17 NOTE — Discharge Summary (Signed)
Discharge Summary    Patient ID: Brenda CiproGloria J Evans,  MRN: [id number], DOB/AGE: December 30, 1950 67 y.o.  Admit date: 06/16/2018 Discharge date: 06/17/2018  Primary Care Provider: Agapito GamesMetheney, Catherine D Primary Cardiologist: Nanetta BattyJonathan Berry, MD  Discharge Diagnoses    Principal Problem:   PAD (peripheral artery disease) [facility](HCC) Active Problems:   TOBACCO ABUSE   HYPERTENSION, BENIGN ESSENTIAL   COPD, moderate (HCC)   Hyperlipidemia   Claudication in peripheral vascular disease (HCC)   Allergies Allergies  Allergen Reactions  . Allegra [Fexofenadine Hcl] Other (See Comments)    Muscle pain.   Marland Kitchen. Fluticasone Other (See Comments)    Nasal sores    Diagnostic Studies/Procedures    Abdominal aortogram 06/16/18: 1: Abdominal aorta- widely patent 2: Left lower extremity- 30 to 40% mid to distal left SFA with three-vessel runoff 3: Right lower extremity- moderately long segment CTO mid right SFA with three-vessel runoff .  IMPRESSION:Ms. Brenda Evans has a moderately long mid right SFA CTO and is experiencing lifestyle limiting claudication. We will proceed with directional atherectomy followed by drug-eluting balloon angioplasty using distal protection.   History of Present Illness     Brenda AlbrightGloria Evans is a 67 y.o. moderately overweight divorced Caucasian female with no children referred by Dr. Clementeen GrahamEvan Corey for peripheral vascular evaluation because of symptomatic right lower extremity claudication.  She worked as an Airline pilotaccountant prior to retiring.  She smoked 50 pack years and continues to smoke 1 pack/day and has treated hypertension hyperlipidemia.  She is never had a heart attack or stroke; she denies chest pain and shortness of breath.  She has complained of right greater than left lower extremity calf claudication less than 1 block for the last several weeks with recent segmental pressures performed 05/09/2018 revealing a right ABI 0.5 in the left 0.91. [facility]  [facility].  Hospital Course     Consultants: none  Ms Brenda Evans presented for planned aortogram, which showed left CTO of right mid SFA s/p atherectomy and DES placement. Procedure via left groin. Last evening, following the procedure, she had hypotension with brief dip in systolic pressure in the 50s, which resolved with IV hydration. She had no other issues overnight. Pressures this morning have been labile, anticipate continuing home medications of HCTZ and losartan. Left groin site with bruising, no bruit, soft. No bleeding.   She is discharged on ASA, plavix, and statin. Plavix is a new medication for her. She will need repeat lipids in 6 weeks.   _____________  Discharge Vitals Blood pressure (!) 143/78, pulse 76, temperature 97.7 F (36.5 C), resp. rate 14, height 5' (1.524 m), weight 73 kg, SpO2 98 %.  Filed Weights   06/16/18 0601 06/16/18 1015 06/17/18 0545  Weight: 74.8 kg 72.6 kg 73 kg    Labs & Radiologic Studies    CBC Recent Labs    06/16/18 0653 06/17/18 0222  WBC 6.6 7.6  HGB 12.8 10.5*  HCT 40.3 33.1*  MCV 101.0* 100.9*  PLT 140* 136*   Basic Metabolic Panel Recent Labs    91/47/8207/10/24 0653 06/17/18 0222  NA 138 137  K 4.6 4.1  CL 102 104  CO2 28 25  GLUCOSE 98 104*  BUN 12 9  CREATININE 0.72 0.72  CALCIUM 8.8* 8.2*   Liver Function Tests No results for input(s): AST, ALT, ALKPHOS, BILITOT, PROT, ALBUMIN in the last 72 hours. No results for input(s): LIPASE, AMYLASE in the last 72 hours. Cardiac Enzymes No results for  input(s): CKTOTAL, CKMB, CKMBINDEX, TROPONINI in the last 72 hours. BNP Invalid input(s): POCBNP D-Dimer No results for input(s): DDIMER in the last 72 hours. Hemoglobin A1C No results for input(s): HGBA1C in the last 72 hours. Fasting Lipid Panel No results for input(s): CHOL, HDL, LDLCALC, TRIG, CHOLHDL, LDLDIRECT in the last 72 hours. Thyroid Function Tests No results for input(s): TSH, T4TOTAL, T3FREE, THYROIDAB in the last 72  hours.  Invalid input(s): FREET3 _____________  Dg Chest 2 View  Result Date: 06/02/2018 CLINICAL DATA:  Preoperative chest x-ray for vascular surgery. EXAM: CHEST - 2 VIEW COMPARISON:  January 14, 2013 FINDINGS: The heart size and mediastinal contours are within normal limits. Both lungs are clear. The visualized skeletal structures are unremarkable. IMPRESSION: No active cardiopulmonary disease. Electronically Signed   By: Gerome Samavid  Williams III M.D   On: 06/02/2018 08:34   Disposition   Pt is being discharged home today in good condition.  Follow-up Plans & Appointments    Follow-up Information    [facility] Follow up on 06/23/2018.   Specialty:  Cardiology Why:  12:30 [facility] Contact information: [address] [place] [id number] [phone]       Runell GessBerry, Jonathan J, MD Follow up on 07/02/2018.   Specialties:  Cardiology, Radiology Why:  9:30 am for follow up Contact information: [address] [place] [phone]          Discharge Instructions    Diet - low sodium heart healthy   Complete by:  As directed    Discharge instructions   Complete by:  As directed    No driving for 2 days. No lifting over 5 lbs for 1 week. No sexual activity for 1 week. You may return to work in 1 week. Keep procedure site clean & dry. If you notice increased pain, swelling, bleeding or pus, call/return!  You may shower, but no soaking baths/hot tubs/pools for 1 week.   Increase activity slowly   Complete by:  As directed       Discharge Medications   Allergies as of 06/17/2018      Reactions   Allegra [fexofenadine Hcl] Other (See Comments)   Muscle pain.    Fluticasone Other (See Comments)   Nasal sores      Medication List    TAKE these medications   albuterol 108 (90 Base) MCG/ACT inhaler Commonly known as:  PROVENTIL HFA;VENTOLIN HFA Inhale 2 puffs into the lungs every 6 (six) hours as  needed for wheezing or shortness of breath.   aspirin 81 MG tablet Take 81 mg by mouth daily.   atorvastatin 40 MG tablet Commonly known as:  LIPITOR Take 1 tablet (40 mg total) by mouth daily.   b complex vitamins tablet Take 1 tablet by mouth daily.   cholecalciferol 1000 units tablet Commonly known as:  VITAMIN D Take 1,000 Units by mouth daily.   clopidogrel 75 MG tablet Commonly known as:  PLAVIX Take 1 tablet (75 mg total) by mouth daily with breakfast.   diclofenac sodium 1 % Gel Commonly known as:  VOLTAREN Apply 4 g topically 4 (four) times daily. To affected joint.   hydrochlorothiazide 12.5 MG capsule Commonly known as:  MICROZIDE TAKE 1 CAPSULE (12.5 MG TOTAL) BY MOUTH DAILY. RETURN FOR NURSE VISIT BP CHECK IN 2 WEEKS.   losartan 100 MG tablet Commonly known as:  COZAAR TAKE 1 TABLET (100 MG TOTAL) BY MOUTH DAILY. DUE FOR FOLLOW  UP VISIT   nicotine 21 mg/24hr patch Commonly known as:  NICODERM CQ - dosed in mg/24 hours Place 21 mg onto the skin daily.   umeclidinium bromide 62.5 MCG/INH Aepb Commonly known as:  INCRUSE ELLIPTA Inhale 1 puff into the lungs daily.        Acute coronary syndrome (MI, NSTEMI, STEMI, etc) this admission?: No.    Outstanding Labs/Studies   Fasting lipids in 6 weeks  Duration of Discharge Encounter   Greater than 30 minutes including physician time.  Signed, Roe Rutherford Mollie Rossano, PA 06/17/2018, 10:14 AM

## 2018-06-17 NOTE — Progress Notes (Signed)
Patient ambulated in hall with standby assist at 0755, with steady gait and no s/s intolerance. Stated she was feeling better and did not have the calf pain that she had prior to the procedure.

## 2018-06-17 NOTE — Progress Notes (Signed)
Progress Note  Patient Name: Brenda CiproGloria J Evans Date of Encounter: 06/17/2018  Primary Cardiologist: Nanetta BattyJonathan Berry, MD   Subjective   Pt feels well this morning and is anticipating discharge.  Inpatient Medications    Scheduled Meds: . aspirin  81 mg Oral Daily  . atorvastatin  40 mg Oral Daily  . clopidogrel  75 mg Oral Q breakfast  . hydrochlorothiazide  12.5 mg Oral Daily  . losartan  100 mg Oral Daily  . nicotine  21 mg Transdermal Daily  . sodium chloride flush  3 mL Intravenous Q12H  . umeclidinium bromide  1 puff Inhalation Daily   Continuous Infusions: . sodium chloride     PRN Meds: sodium chloride, acetaminophen, albuterol, hydrALAZINE, labetalol, morphine injection, ondansetron (ZOFRAN) IV, sodium chloride flush   Vital Signs    Vitals:   06/17/18 0005 06/17/18 0545 06/17/18 0635 06/17/18 0738  BP: 117/66 124/60 139/70 (!) 143/78  Pulse:  79  76  Resp: 11 15 12 14   Temp:  97.7 F (36.5 C)  97.7 F (36.5 C)  TempSrc:  Oral    SpO2:  99%  100%  Weight:  73 kg    Height:        Intake/Output Summary (Last 24 hours) at 06/17/2018 0757 Last data filed at 06/17/2018 0550 Gross per 24 hour  Intake 815.63 ml  Output 900 ml  Net -84.37 ml   Filed Weights   06/16/18 0601 06/16/18 1015 06/17/18 0545  Weight: 74.8 kg 72.6 kg 73 kg    Telemetry    sinus - Personally Reviewed  ECG    No new tracings - Personally Reviewed  Physical Exam   GEN: No acute distress.   Neck: No JVD Cardiac: RRR, no murmurs, rubs, or gallops.  Respiratory: Clear to auscultation bilaterally. GI: Soft, nontender, non-distended  MS: No edema; No deformity. Left groin site C/D/I, bruising noted Neuro:  Nonfocal  Psych: Normal affect   Labs    Chemistry Recent Labs  Lab 06/16/18 0653 06/17/18 0222  NA 138 137  K 4.6 4.1  CL 102 104  CO2 28 25  GLUCOSE 98 104*  BUN 12 9  CREATININE 0.72 0.72  CALCIUM 8.8* 8.2*  GFRNONAA >60 >60  GFRAA >60 >60  ANIONGAP 8 8     Hematology Recent Labs  Lab 06/16/18 0653 06/17/18 0222  WBC 6.6 7.6  RBC 3.99 3.28*  HGB 12.8 10.5*  HCT 40.3 33.1*  MCV 101.0* 100.9*  MCH 32.1 32.0  MCHC 31.8 31.7  RDW 15.6* 15.6*  PLT 140* 136*    Cardiac EnzymesNo results for input(s): TROPONINI in the last 168 hours. No results for input(s): TROPIPOC in the last 168 hours.   BNPNo results for input(s): BNP, PROBNP in the last 168 hours.   DDimer No results for input(s): DDIMER in the last 168 hours.   Radiology    No results found.  Cardiac Studies   Abdominal aortogram 06/16/18: 1: Abdominal aorta- widely patent 2: Left lower extremity- 30 to 40% mid to distal left SFA with three-vessel runoff 3: Right lower extremity- moderately long segment CTO mid right SFA with three-vessel runoff .  IMPRESSION: Brenda Evans has a moderately long mid right SFA CTO and is experiencing lifestyle limiting claudication.  We will proceed with directional atherectomy followed by drug-eluting balloon angioplasty using distal protection.  Patient Profile     67 y.o. female with a history of HTN, HLD, smoking, and claudication found to have abnormal  ABIs. [facility].  Assessment & Plan    1. PAD s/p aortogram with CTO of mid right SFA, directional atherectomy and DES placed - pt had hypotension last evening that improved with fluids - groin site with hematoma but no bruit - pressures have been labile - Hb 10.5 (12.8), no active bleeding - continue ASA, plavix and statin   2. HTN - pressures have been labile - hypotension last evening resolved with IVF - home HCTZ 12.5 mg daily, losartan 100 mg daily   3. Current smoker - encouraged cessation - COPD   4. HLD - 05/05/2018: Cholesterol 187; HDL 59; LDL Cholesterol (Calc) 107; Triglycerides 113 - continue statin, repeat lipids in 6 weeks    For questions or updates, please contact [facility] Please consult [email or web address] for contact info  under Cardiology/STEMI.      Signed, Roe Rutherfordngela Nicole Duke, PA  06/17/2018, 7:57 AM

## 2018-06-18 LAB — POCT ACTIVATED CLOTTING TIME
ACTIVATED CLOTTING TIME: 296 s
ACTIVATED CLOTTING TIME: 257 s

## 2018-06-22 ENCOUNTER — Encounter: Payer: Self-pay | Admitting: Family Medicine

## 2018-06-23 ENCOUNTER — Inpatient Hospital Stay (HOSPITAL_COMMUNITY): Admission: RE | Admit: 2018-06-23 | Payer: Medicare Other | Source: Ambulatory Visit

## 2018-06-24 MED ORDER — BUPROPION HCL ER (XL) 150 MG PO TB24: 150.0000 mg | ORAL_TABLET | ORAL | 2 refills | Status: DC

## 2018-06-24 NOTE — Addendum Note (Signed)
Addended by: Nani GasserMETHENEY, CATHERINE D on: 06/24/2018 05:30 PM   Modules accepted: Orders

## 2018-06-25 ENCOUNTER — Ambulatory Visit (HOSPITAL_COMMUNITY)
Admission: RE | Admit: 2018-06-25 | Discharge: 2018-06-25 | Disposition: A | Discharge status: Home / Self Care | Payer: Medicare Other | Source: Ambulatory Visit | Attending: Cardiovascular Disease | Admitting: Cardiovascular Disease

## 2018-06-25 DIAGNOSIS — Z9862 Peripheral vascular angioplasty status: Secondary | ICD-10-CM

## 2018-06-25 DIAGNOSIS — I739 Peripheral vascular disease, unspecified: Secondary | ICD-10-CM | POA: Diagnosis not present

## 2018-07-02 ENCOUNTER — Ambulatory Visit (INDEPENDENT_AMBULATORY_CARE_PROVIDER_SITE_OTHER): Payer: Medicare Other | Admitting: Cardiovascular Disease

## 2018-07-02 ENCOUNTER — Encounter: Payer: Self-pay | Admitting: Cardiovascular Disease

## 2018-07-02 DIAGNOSIS — F17200 Nicotine dependence, unspecified, uncomplicated: Secondary | ICD-10-CM

## 2018-07-02 DIAGNOSIS — I739 Peripheral vascular disease, unspecified: Secondary | ICD-10-CM

## 2018-07-02 DIAGNOSIS — I10 Essential (primary) hypertension: Secondary | ICD-10-CM | POA: Diagnosis not present

## 2018-07-02 MED ORDER — ATORVASTATIN CALCIUM 20 MG PO TABS: 20.0000 mg | ORAL_TABLET | Freq: Every day | ORAL | 3 refills | Status: DC

## 2018-07-02 NOTE — Assessment & Plan Note (Signed)
History of essential hypertension her blood pressure measured today 118/76.  She is on hydrochlorothiazide and losartan.  Continue current meds at current dosing.

## 2018-07-02 NOTE — Patient Instructions (Signed)
Medication Instructions:  Your physician has recommended you make the following change in your medication:  1) START Atorvastatin 20 mg tablet by mouth daily, with dinner   Labwork: Your physician recommends that you return for lab work in: FASTING November 2019   Testing/Procedures: Your physician has requested that you have a lower extremity arterial exercise duplex. During this test, exercise and ultrasound are used to evaluate arterial blood flow in the legs. Allow one hour for this exam. There are no restrictions or special instructions. Your physician has requested that you have an ankle brachial index (ABI). During this test an ultrasound and blood pressure cuff are used to evaluate the arteries that supply the arms and legs with blood. Allow thirty minutes for this exam. There are no restrictions or special instructions.  Schedule both for January 2020  Follow-Up: Your physician wants you to follow-up in: 1 year with Dr. Allyson SabalBerry. You will receive a reminder letter in the mail two months in advance. If you don't receive a letter, please call our office to schedule the follow-up appointment.   Any Other Special Instructions Will Be Listed Below (If Applicable).     If you need a refill on your cardiac medications before your next appointment, please call your pharmacy.

## 2018-07-02 NOTE — Assessment & Plan Note (Signed)
History of PAD status post right SFA CTO intervention by myself [facility] and drug-eluting balloon angioplasty.  Her follow-up Dopplers showed an improvement in her right ABI from 0.5 up to 1.  Her claudication has resolved.  She is on low-dose aspirin and Plavix currently.

## 2018-07-02 NOTE — Progress Notes (Signed)
07/02/2018 Brenda Evans   1951/03/28  [id number]  Primary Physician Agapito Games, MD Primary Cardiologist: Runell Gess MD Milagros Loll, [place], [place]  HPI:  Brenda Evans is a 67 y.o.  moderately overweight divorced Caucasian female with no children referred by Dr. Clementeen Graham for peripheral vascular evaluation because of symptomatic right lower extremity claudication.  I last saw her in the office 05/20/2018. She worked as an Airline pilot prior to retiring.  She smoked 50 pack years and continues to smoke 1 pack/day and has treated hypertension hyperlipidemia.  She is never had a heart attack or stroke denies chest pain or shortness of breath.  She is complained of right greater than left lower extremity calf claudication less than 1 block for the last several weeks with recent segmental pressures performed 05/09/2018 revealing a right ABI 0.5 in the left 0.91.  I performed peripheral angiography on her 06/16/2018 revealing a moderately long segment CTO mid right SFA which I performed directional atherectomy and drug-eluting balloon angioplasty.  Her final angiographic results revealed 0% residual.  She did have three-vessel runoff bilaterally.  Her follow-up Dopplers performed the following week showed improvement in her right ABI from 0.5 up to 1 and her claudication has resolved. Current Meds  Medication Sig  . albuterol (PROAIR HFA) 108 (90 Base) MCG/ACT inhaler Inhale 2 puffs into the lungs every 6 (six) hours as needed for wheezing or shortness of breath.  Marland Kitchen aspirin 81 MG tablet Take 81 mg by mouth daily.    Marland Kitchen atorvastatin (LIPITOR) 40 MG tablet Take 1 tablet (40 mg total) by mouth daily.  Marland Kitchen b complex vitamins tablet Take 1 tablet by mouth daily.  Marland Kitchen buPROPion (WELLBUTRIN XL) 150 MG 24 hr tablet Take 1 tablet (150 mg total) by mouth every morning.  . cholecalciferol (VITAMIN D) 1000 units tablet Take 1,000 Units by mouth daily.   . clopidogrel (PLAVIX) 75 MG tablet Take 1 tablet  (75 mg total) by mouth daily with breakfast.  . diclofenac sodium (VOLTAREN) 1 % GEL Apply 4 g topically 4 (four) times daily. To affected joint.  . hydrochlorothiazide (MICROZIDE) 12.5 MG capsule TAKE 1 CAPSULE (12.5 MG TOTAL) BY MOUTH DAILY. RETURN FOR NURSE VISIT BP CHECK IN 2 WEEKS.  . losartan (COZAAR) 100 MG tablet TAKE 1 TABLET (100 MG TOTAL) BY MOUTH DAILY. DUE FOR FOLLOW UP VISIT  . nicotine (NICODERM CQ - DOSED IN MG/24 HOURS) 21 mg/24hr patch Place 21 mg onto the skin daily.  Marland Kitchen umeclidinium bromide (INCRUSE ELLIPTA) 62.5 MCG/INH AEPB Inhale 1 puff into the lungs daily.     Allergies  Allergen Reactions  . Allegra [Fexofenadine Hcl] Other (See Comments)    Muscle pain.   Marland Kitchen Fluticasone Other (See Comments)    Nasal sores    Social History   Socioeconomic History  . Marital status: Divorced    Spouse name: Not on file  . Number of children: Not on file  . Years of education: Not on file  . Highest education level: Not on file  Occupational History  . Not on file  Social Needs  . Financial resource strain: Not on file  . Food insecurity:    Worry: Not on file    Inability: Not on file  . Transportation needs:    Medical: Not on file    Non-medical: Not on file  Tobacco Use  . Smoking status: Current Every Day Smoker    Packs/day: 1.00  Years: 50.00    Pack years: 50.00    Types: Cigarettes  . Smokeless tobacco: Never Used  Substance and Sexual Activity  . Alcohol use: Yes    Comment: 06/16/2018 "a few drinks/year"  . Drug use: No  . Sexual activity: Not Currently  Lifestyle  . Physical activity:    Days per week: Not on file    Minutes per session: Not on file  . Stress: Not on file  Relationships  . Social connections:    Talks on phone: Not on file    Gets together: Not on file    Attends religious service: Not on file    Active member of club or organization: Not on file    Attends meetings of clubs or organizations: Not on file    Relationship  status: Not on file  . Intimate partner violence:    Fear of current or ex partner: Not on file    Emotionally abused: Not on file    Physically abused: Not on file    Forced sexual activity: Not on file  Other Topics Concern  . Not on file  Social History Narrative  . Not on file     Review of Systems: General: negative for chills, fever, night sweats or weight changes.  Cardiovascular: negative for chest pain, dyspnea on exertion, edema, orthopnea, palpitations, paroxysmal nocturnal dyspnea or shortness of breath Dermatological: negative for rash Respiratory: negative for cough or wheezing Urologic: negative for hematuria Abdominal: negative for nausea, vomiting, diarrhea, bright red blood per rectum, melena, or hematemesis Neurologic: negative for visual changes, syncope, or dizziness All other systems reviewed and are otherwise negative except as noted above.    Blood pressure 118/76, pulse 67, height 5' (1.524 m), weight 166 lb 3.2 oz (75.4 kg).  General appearance: alert and no distress Neck: no adenopathy, no carotid bruit, no JVD, supple, symmetrical, trachea midline and thyroid not enlarged, symmetric, no tenderness/mass/nodules Lungs: clear to auscultation bilaterally Heart: regular rate and rhythm, S1, S2 normal, no murmur, click, rub or gallop Extremities: extremities normal, atraumatic, no cyanosis or edema Pulses: 2+ and symmetric Skin: Skin color, texture, turgor normal. No rashes or lesions Neurologic: Alert and oriented X 3, normal strength and tone. Normal symmetric reflexes. Normal coordination and gait  EKG not performed today  ASSESSMENT AND PLAN:   TOBACCO ABUSE Ongoing tobacco abuse although she has started Wellbutrin and has a desire to stop  HYPERTENSION, BENIGN ESSENTIAL History of essential hypertension her blood pressure measured today 118/76.  She is on hydrochlorothiazide and losartan.  Continue current meds at current dosing.  PAD (peripheral  artery disease) (HCC) History of PAD status post right SFA CTO intervention by myself [facility]/16/2018 [facility] directional atherectomy and drug-eluting balloon angioplasty.  Her follow-up Dopplers showed an improvement in her right ABI from 0.5 up to 1.  Her claudication has resolved.  She is on low-dose aspirin and Plavix currently.  Hyperlipidemia She of hyperlipidemia currently not on statin therapy with LDL of 107 measured on 05/06/2018.  We will start atorvastatin 20 mill grams a day and will recheck a lipid liver profile in 3 months.      Runell GessJonathan J. Nithya Meriweather MD FACP,FACC,FAHA, [facility] 07/02/2018 10:27 AM

## 2018-07-02 NOTE — Assessment & Plan Note (Signed)
She of hyperlipidemia currently not on statin therapy with LDL of 107 measured on 05/06/2018.  We will start atorvastatin 20 mill grams a day and will recheck a lipid liver profile in 3 months.

## 2018-07-02 NOTE — Assessment & Plan Note (Addendum)
Ongoing tobacco abuse although she has started Wellbutrin and has a desire to stop

## 2018-07-17 ENCOUNTER — Other Ambulatory Visit: Payer: Self-pay | Admitting: Family Medicine

## 2018-08-31 ENCOUNTER — Other Ambulatory Visit: Payer: Self-pay | Admitting: Family Medicine

## 2018-08-31 DIAGNOSIS — J449 Chronic obstructive pulmonary disease, unspecified: Secondary | ICD-10-CM

## 2018-09-01 NOTE — Telephone Encounter (Signed)
Needs appointment

## 2018-09-11 ENCOUNTER — Encounter: Payer: Self-pay | Admitting: Family Medicine

## 2018-09-17 DIAGNOSIS — Z23 Encounter for immunization: Secondary | ICD-10-CM | POA: Diagnosis not present

## 2018-10-01 ENCOUNTER — Encounter: Payer: Self-pay | Admitting: *Deleted

## 2018-10-07 ENCOUNTER — Other Ambulatory Visit: Payer: Self-pay | Admitting: Family Medicine

## 2018-10-16 ENCOUNTER — Encounter: Payer: Self-pay | Admitting: Family Medicine

## 2018-11-21 ENCOUNTER — Other Ambulatory Visit: Payer: Self-pay | Admitting: Family Medicine

## 2018-11-21 DIAGNOSIS — I10 Essential (primary) hypertension: Secondary | ICD-10-CM

## 2018-11-30 ENCOUNTER — Other Ambulatory Visit: Payer: Self-pay | Admitting: Family Medicine

## 2018-11-30 DIAGNOSIS — J449 Chronic obstructive pulmonary disease, unspecified: Secondary | ICD-10-CM

## 2018-12-17 ENCOUNTER — Other Ambulatory Visit: Payer: Self-pay | Admitting: Family Medicine

## 2018-12-17 DIAGNOSIS — I10 Essential (primary) hypertension: Secondary | ICD-10-CM

## 2018-12-24 DIAGNOSIS — H02055 Trichiasis without entropian left lower eyelid: Secondary | ICD-10-CM | POA: Diagnosis not present

## 2019-01-07 ENCOUNTER — Other Ambulatory Visit: Payer: Self-pay | Admitting: Family Medicine

## 2019-01-11 ENCOUNTER — Other Ambulatory Visit: Payer: Self-pay | Admitting: Family Medicine

## 2019-01-11 DIAGNOSIS — I10 Essential (primary) hypertension: Secondary | ICD-10-CM

## 2019-01-19 ENCOUNTER — Telehealth: Payer: Self-pay | Admitting: *Deleted

## 2019-01-19 MED ORDER — VALSARTAN 320 MG PO TABS: 320.0000 mg | ORAL_TABLET | Freq: Every day | ORAL | 0 refills | Status: DC

## 2019-01-19 NOTE — Telephone Encounter (Signed)
Received fax from [facility] that Losartan 100 mg is on backorder. Alternative requested.Heath Gold, CMA

## 2019-01-19 NOTE — Telephone Encounter (Signed)
She needs an appt. I haven't seen her in over a year.  Sent over 15 tabs of Valsartan.

## 2019-01-22 ENCOUNTER — Other Ambulatory Visit: Payer: Self-pay | Admitting: Family Medicine

## 2019-01-22 MED ORDER — VALSARTAN 320 MG PO TABS: 320.0000 mg | ORAL_TABLET | Freq: Every day | ORAL | 0 refills | Status: DC

## 2019-01-22 NOTE — Telephone Encounter (Signed)
Must be seen no further refills given. #5 given on 01/22/2019 until appointment made

## 2019-02-01 ENCOUNTER — Other Ambulatory Visit: Payer: Self-pay | Admitting: Family Medicine

## 2019-02-02 NOTE — Progress Notes (Signed)
Virtual Visit via Telephone Note  I connected with Brenda Evans on 02/03/19 at  8:10 AM EDT by telephone and verified that I am speaking with the correct person using two identifiers.   I discussed the limitations, risks, security and privacy concerns of performing an evaluation and management service by telephone and the availability of in person appointments. I also discussed with the patient that there may be a patient responsible charge related to this service. The patient expressed understanding and agreed to proceed.    Subjective:    CC: HTN  HPI:  Hypertension- Pt denies chest pain, SOB, dizziness, or heart palpitations.  Taking meds as directed w/o problems.  Denies medication side effects.    F/U PAD - on lipitor and doing well.  No pain in her legs.   F/U COPD - No recent flares.  She is doing well. She has cut back on her smoking.  She is down to 1 ppd. She says the wellbutrin.  Has really helps and had given her energy.   BP 123/65   Wt 175 lb (79.4 kg)   BMI 34.18 kg/m     Allergies  Allergen Reactions  . Allegra [Fexofenadine Hcl] Other (See Comments)    Muscle pain.   Marland Kitchen Fluticasone Other (See Comments)    Nasal sores    Past Medical History:  Diagnosis Date  . Arthritis    "probably" (06/16/2018)  . COPD (chronic obstructive pulmonary disease) (HCC)   . High cholesterol   . Hypertension   . PAD (peripheral artery disease) (HCC)     Past Surgical History:  Procedure Laterality Date  . ABDOMINAL AORTOGRAM W/LOWER EXTREMITY N/A 06/16/2018   Procedure: ABDOMINAL AORTOGRAM W/LOWER EXTREMITY;  Surgeon: Runell Gess, MD;  Location: [facility];  Service: Cardiovascular;  Laterality: N/A;  . PERIPHERAL VASCULAR ATHERECTOMY Right 06/16/2018   Procedure: PERIPHERAL VASCULAR ATHERECTOMY;  Surgeon: Runell Gess, MD;  Location: [facility] CV [facility];  Service: Cardiovascular;  Laterality: Right;  . TONSILLECTOMY  1965  . TUBAL LIGATION      Social  History   Socioeconomic History  . Marital status: Divorced    Spouse name: Not on file  . Number of children: Not on file  . Years of education: Not on file  . Highest education level: Not on file  Occupational History  . Not on file  Social Needs  . Financial resource strain: Not on file  . Food insecurity:    Worry: Not on file    Inability: Not on file  . Transportation needs:    Medical: Not on file    Non-medical: Not on file  Tobacco Use  . Smoking status: Current Every Day Smoker    Packs/day: 1.00    Years: 50.00    Pack years: 50.00    Types: Cigarettes  . Smokeless tobacco: Never Used  Substance and Sexual Activity  . Alcohol use: Yes    Comment: 06/16/2018 "a few drinks/year"  . Drug use: No  . Sexual activity: Not Currently  Lifestyle  . Physical activity:    Days per week: Not on file    Minutes per session: Not on file  . Stress: Not on file  Relationships  . Social connections:    Talks on phone: Not on file    Gets together: Not on file    Attends religious service: Not on file    Active member of club or organization: Not on file  Attends meetings of clubs or organizations: Not on file    Relationship status: Not on file  . Intimate partner violence:    Fear of current or ex partner: Not on file    Emotionally abused: Not on file    Physically abused: Not on file    Forced sexual activity: Not on file  Other Topics Concern  . Not on file  Social History Narrative  . Not on file    Family History  Problem Relation Age of Onset  . Heart attack Other   . Hypertension Other   . Stroke Other     Outpatient Encounter Medications as of 02/03/2019  Medication Sig  . albuterol (PROAIR HFA) 108 (90 Base) MCG/ACT inhaler Inhale 2 puffs into the lungs every 6 (six) hours as needed for wheezing or shortness of breath.  Marland Kitchen aspirin 81 MG tablet Take 81 mg by mouth daily.    Marland Kitchen atorvastatin (LIPITOR) 40 MG tablet Take 1 tablet by mouth at bedtime.  Marland Kitchen  b complex vitamins tablet Take 1 tablet by mouth daily.  Marland Kitchen buPROPion (WELLBUTRIN XL) 150 MG 24 hr tablet TAKE 1 TABLET BY MOUTH EVERY DAY IN THE MORNING  . cholecalciferol (VITAMIN D) 1000 units tablet Take 1,000 Units by mouth daily.   . clopidogrel (PLAVIX) 75 MG tablet Take 1 tablet (75 mg total) by mouth daily with breakfast.  . hydrochlorothiazide (MICROZIDE) 12.5 MG capsule Take 1 capsule (12.5 mg total) by mouth daily.  . INCRUSE ELLIPTA 62.5 MCG/INH AEPB INHALE 1 PUFF INTO THE LUNGS DAILY. MUST MAKE APPOINTMENT  . valsartan (DIOVAN) 320 MG tablet Take 1 tablet (320 mg total) by mouth daily. MUST MAKE APPOINTMENT.  NO MORE REFILLS WILL BE GIVEN  . [DISCONTINUED] diclofenac sodium (VOLTAREN) 1 % GEL Apply 4 g topically 4 (four) times daily. To affected joint.  . [DISCONTINUED] nicotine (NICODERM CQ - DOSED IN MG/24 HOURS) 21 mg/24hr patch Place 21 mg onto the skin daily.   No facility-administered encounter medications on file as of 02/03/2019.        Review of Systems: No fevers, chills, night sweats, weight loss, chest pain, or shortness of breath.   Objective:    General: Speaking clearly in complete sentences without any shortness of breath.  Alert and oriented x3.  Normal judgment. No apparent acute distress.    Impression and Recommendations:    HTN - Well controlled. Continue current regimen. Follow up in  3-4 months    PAD - stable, no pain.  On a statin.    COPD - Stable on Incruse and needs refill Albuterol.   Tobacco abuse.  She has cut down to 1 ppd.    She agrees for the Cologuard.   Meds ordered this encounter  Medications  . valsartan (DIOVAN) 320 MG tablet    Sig: Take 1 tablet (320 mg total) by mouth daily.    Dispense:  90 tablet    Refill:  1  . albuterol (PROAIR HFA) 108 (90 Base) MCG/ACT inhaler    Sig: Inhale 2 puffs into the lungs every 6 (six) hours as needed for wheezing or shortness of breath.    Dispense:  1 Inhaler    Refill:  4  .  buPROPion (WELLBUTRIN XL) 150 MG 24 hr tablet    Sig: TAKE 1 TABLET BY MOUTH EVERY DAY IN THE MORNING    Dispense:  90 tablet    Refill:  1  . umeclidinium bromide (INCRUSE ELLIPTA) 62.5 MCG/INH AEPB  Sig: Inhale 1 puff into the lungs daily.    Dispense:  90 each    Refill:  1       I discussed the assessment and treatment plan with the patient. The patient was provided an opportunity to ask questions and all were answered. The patient agreed with the plan and demonstrated an understanding of the instructions.   The patient was advised to call back or seek an in-person evaluation if the symptoms worsen or if the condition fails to improve as anticipated.  I provided 16 minutes of non-face-to-face time during this encounter.   Nani Gasser, MD

## 2019-02-02 NOTE — Telephone Encounter (Signed)
Patient scheduled.

## 2019-02-02 NOTE — Telephone Encounter (Signed)
Has to schedule a virtual visit for refills.

## 2019-02-03 ENCOUNTER — Encounter: Payer: Self-pay | Admitting: Family Medicine

## 2019-02-03 ENCOUNTER — Ambulatory Visit (INDEPENDENT_AMBULATORY_CARE_PROVIDER_SITE_OTHER): Payer: Medicare Other | Admitting: Family Medicine

## 2019-02-03 ENCOUNTER — Other Ambulatory Visit: Payer: Self-pay

## 2019-02-03 VITALS — BP 123/65 | Wt 175.0 lb

## 2019-02-03 DIAGNOSIS — F17210 Nicotine dependence, cigarettes, uncomplicated: Secondary | ICD-10-CM | POA: Diagnosis not present

## 2019-02-03 DIAGNOSIS — Z1211 Encounter for screening for malignant neoplasm of colon: Secondary | ICD-10-CM

## 2019-02-03 DIAGNOSIS — I10 Essential (primary) hypertension: Secondary | ICD-10-CM | POA: Diagnosis not present

## 2019-02-03 DIAGNOSIS — F17200 Nicotine dependence, unspecified, uncomplicated: Secondary | ICD-10-CM

## 2019-02-03 DIAGNOSIS — J449 Chronic obstructive pulmonary disease, unspecified: Secondary | ICD-10-CM | POA: Diagnosis not present

## 2019-02-03 DIAGNOSIS — I739 Peripheral vascular disease, unspecified: Secondary | ICD-10-CM

## 2019-02-03 MED ORDER — BUPROPION HCL ER (XL) 150 MG PO TB24: ORAL_TABLET | ORAL | 1 refills | Status: DC

## 2019-02-03 MED ORDER — ALBUTEROL SULFATE HFA 108 (90 BASE) MCG/ACT IN AERS: 2.0000 | INHALATION_SPRAY | Freq: Four times a day (QID) | RESPIRATORY_TRACT | 4 refills | Status: AC | PRN

## 2019-02-03 MED ORDER — UMECLIDINIUM BROMIDE 62.5 MCG/INH IN AEPB: 1.0000 | INHALATION_SPRAY | Freq: Every day | RESPIRATORY_TRACT | 1 refills | Status: DC

## 2019-02-03 MED ORDER — VALSARTAN 320 MG PO TABS: 320.0000 mg | ORAL_TABLET | Freq: Every day | ORAL | 1 refills | Status: DC

## 2019-02-03 MED ORDER — LOSARTAN POTASSIUM 100 MG PO TABS: 100.0000 mg | ORAL_TABLET | Freq: Every day | ORAL | 0 refills | Status: AC

## 2019-02-03 NOTE — Telephone Encounter (Signed)
OK, will change to losartan. Will need to call pharmacy and cacel valsartan. I wasn't aware she was on the other.

## 2019-02-05 ENCOUNTER — Telehealth: Payer: Self-pay | Admitting: *Deleted

## 2019-02-05 NOTE — Addendum Note (Signed)
Addended by: Deno Etienne on: 02/05/2019 10:57 AM   Modules accepted: Orders

## 2019-02-05 NOTE — Telephone Encounter (Signed)
Order for cologuard faxed and confirmation received .Marland KitchenLoralee Pacas [place]

## 2019-02-19 DIAGNOSIS — Z1211 Encounter for screening for malignant neoplasm of colon: Secondary | ICD-10-CM | POA: Diagnosis not present

## 2019-02-25 LAB — HM COLONOSCOPY

## 2019-02-27 ENCOUNTER — Telehealth: Payer: Self-pay | Admitting: Family Medicine

## 2019-02-27 NOTE — Telephone Encounter (Signed)
Pt advised of results, verbalized understanding. No further questions.  

## 2019-02-27 NOTE — Telephone Encounter (Signed)
Call pt: negative cologuard. Repeat colon cancer screening in 3 years.

## 2019-03-12 ENCOUNTER — Encounter: Payer: Self-pay | Admitting: Family Medicine

## 2019-03-17 ENCOUNTER — Encounter: Payer: Self-pay | Admitting: Family Medicine

## 2019-03-17 ENCOUNTER — Ambulatory Visit (INDEPENDENT_AMBULATORY_CARE_PROVIDER_SITE_OTHER): Payer: Medicare Other | Admitting: Family Medicine

## 2019-03-17 VITALS — BP 100/60 | Ht 60.0 in | Wt 175.0 lb

## 2019-03-17 DIAGNOSIS — I959 Hypotension, unspecified: Secondary | ICD-10-CM | POA: Diagnosis not present

## 2019-03-17 DIAGNOSIS — M79651 Pain in right thigh: Secondary | ICD-10-CM

## 2019-03-17 DIAGNOSIS — I739 Peripheral vascular disease, unspecified: Secondary | ICD-10-CM | POA: Diagnosis not present

## 2019-03-17 NOTE — Telephone Encounter (Signed)
Called Pt and added on for telephone visit today to see if [place] needs to be ordered

## 2019-03-17 NOTE — Progress Notes (Signed)
R upper leg pain x 1 wk. She has taking Tylenol this has helped some. She feels pain when she is going up stairs or standing,if she is walking there isn't any pain. She describes it as a dull achy pain. Denies any injury or trauma. She recently began a exercise class for seniors.Heath Gold, CMA

## 2019-03-17 NOTE — Progress Notes (Signed)
Virtual Visit via Telephone Note  I connected with Brenda Evans on 03/17/19 at  4:20 PM EDT by telephone and verified that I am speaking with the correct person using two identifiers.   I discussed the limitations, risks, security and privacy concerns of performing an evaluation and management service by telephone and the availability of in person appointments. I also discussed with the patient that there may be a patient responsible charge related to this service. The patient expressed understanding and agreed to proceed.  Pt was at home and I was in my office for the virtual visit.     Subjective:    CC: right upper leg pain.   HPI:  R middle upper leg pain x 1.5 wk. She has taking Tylenol this has helped some. She feels pain when she is going up stairs or standing,if she is walking there isn't any pain. She describes it as a dull achy pain. Denies any injury or trauma. She recently began a exercise class for seniors but stopped thinking she had overdone it but that hasn't resolved her pain. Nontender.  No weakness or swelling.  No back pain with it.  Foot is not going to sleep this time. No CP or SOB.  No dusky of her foot.  + 1ppd smoker. No pain with hip flexion.  No radiation.  Tylenol does make it feel better.    No CP or SOB.    Past medical history, Surgical history, Family history not pertinant except as noted below, Social history, Allergies, and medications have been entered into the medical record, reviewed, and corrections made.   Review of Systems: No fevers, chills, night sweats, weight loss, chest pain, or shortness of breath.   Objective:      General: Speaking clearly in complete sentences without any shortness of breath.  Alert and oriented x3.  Normal judgment. No apparent acute distress.    Impression and Recommendations:    Right thigh pain - she doesn't want to travel to [place]. Wants to go to [place] location if at all possible.  Concern for PAD worsening.  Unlikely to be a DVT bc of location. She is actually past due for repeat Lower ext duplex. She was supposed to f/u in 6 mo. She says she is taking her ASA and plavix.    tob abuse - Encouraged cutting back.    Hypotension - her BP is alittle low today. She says she feels OK. I asked her ot check it again later and let me know if still runnin low.        I discussed the assessment and treatment plan with the patient. The patient was provided an opportunity to ask questions and all were answered. The patient agreed with the plan and demonstrated an understanding of the instructions.   The patient was advised to call back or seek an in-person evaluation if the symptoms worsen or if the condition fails to improve as anticipated.  I provided 20 minutes of non-face-to-face time during this encounter.   Nani Gasser, MD

## 2019-03-18 ENCOUNTER — Encounter: Payer: Self-pay | Admitting: Family Medicine

## 2019-03-24 ENCOUNTER — Ambulatory Visit (HOSPITAL_BASED_OUTPATIENT_CLINIC_OR_DEPARTMENT_OTHER): Payer: Medicare Other

## 2019-03-31 ENCOUNTER — Ambulatory Visit (HOSPITAL_BASED_OUTPATIENT_CLINIC_OR_DEPARTMENT_OTHER)
Admission: RE | Admit: 2019-03-31 | Discharge: 2019-03-31 | Disposition: A | Discharge status: Home / Self Care | Payer: Medicare Other | Source: Ambulatory Visit | Attending: Family Medicine | Admitting: Family Medicine

## 2019-03-31 ENCOUNTER — Other Ambulatory Visit: Payer: Self-pay

## 2019-03-31 DIAGNOSIS — I739 Peripheral vascular disease, unspecified: Secondary | ICD-10-CM | POA: Diagnosis not present

## 2019-03-31 DIAGNOSIS — M79651 Pain in right thigh: Secondary | ICD-10-CM

## 2019-04-01 ENCOUNTER — Encounter: Payer: Self-pay | Admitting: Family Medicine

## 2019-04-01 DIAGNOSIS — I739 Peripheral vascular disease, unspecified: Secondary | ICD-10-CM

## 2019-04-07 ENCOUNTER — Encounter: Payer: Self-pay | Admitting: Family Medicine

## 2019-04-08 NOTE — Telephone Encounter (Signed)
I called Vascular they have patient's referral and they will call to schedule I called and informed patient - CF

## 2019-04-09 ENCOUNTER — Encounter: Payer: Self-pay | Admitting: Family Medicine

## 2019-04-13 ENCOUNTER — Encounter: Payer: Self-pay | Admitting: Family Medicine

## 2019-04-14 DIAGNOSIS — Z720 Tobacco use: Secondary | ICD-10-CM | POA: Diagnosis not present

## 2019-04-14 DIAGNOSIS — E785 Hyperlipidemia, unspecified: Secondary | ICD-10-CM | POA: Diagnosis not present

## 2019-04-14 DIAGNOSIS — I10 Essential (primary) hypertension: Secondary | ICD-10-CM | POA: Diagnosis not present

## 2019-04-14 DIAGNOSIS — J449 Chronic obstructive pulmonary disease, unspecified: Secondary | ICD-10-CM | POA: Diagnosis not present

## 2019-04-14 DIAGNOSIS — I739 Peripheral vascular disease, unspecified: Secondary | ICD-10-CM | POA: Diagnosis not present

## 2019-04-14 DIAGNOSIS — M810 Age-related osteoporosis without current pathological fracture: Secondary | ICD-10-CM | POA: Diagnosis not present

## 2019-04-16 DIAGNOSIS — I739 Peripheral vascular disease, unspecified: Secondary | ICD-10-CM | POA: Diagnosis not present

## 2019-04-16 DIAGNOSIS — Z720 Tobacco use: Secondary | ICD-10-CM | POA: Diagnosis not present

## 2019-04-16 DIAGNOSIS — I6523 Occlusion and stenosis of bilateral carotid arteries: Secondary | ICD-10-CM | POA: Diagnosis not present

## 2019-04-16 DIAGNOSIS — M79604 Pain in right leg: Secondary | ICD-10-CM | POA: Diagnosis not present

## 2019-04-16 DIAGNOSIS — I10 Essential (primary) hypertension: Secondary | ICD-10-CM | POA: Diagnosis not present

## 2019-04-20 DIAGNOSIS — R609 Edema, unspecified: Secondary | ICD-10-CM | POA: Diagnosis not present

## 2019-04-20 DIAGNOSIS — R0600 Dyspnea, unspecified: Secondary | ICD-10-CM | POA: Diagnosis not present

## 2019-04-24 DIAGNOSIS — I739 Peripheral vascular disease, unspecified: Secondary | ICD-10-CM | POA: Diagnosis not present

## 2019-04-24 DIAGNOSIS — I6523 Occlusion and stenosis of bilateral carotid arteries: Secondary | ICD-10-CM | POA: Diagnosis not present

## 2019-04-24 DIAGNOSIS — M79604 Pain in right leg: Secondary | ICD-10-CM | POA: Diagnosis not present

## 2019-06-08 ENCOUNTER — Other Ambulatory Visit: Payer: Self-pay

## 2019-08-10 ENCOUNTER — Other Ambulatory Visit: Payer: Self-pay | Admitting: Family Medicine

## 2019-08-10 DIAGNOSIS — J449 Chronic obstructive pulmonary disease, unspecified: Secondary | ICD-10-CM

## 2019-08-12 DIAGNOSIS — Z23 Encounter for immunization: Secondary | ICD-10-CM | POA: Diagnosis not present

## 2019-08-12 DIAGNOSIS — Z720 Tobacco use: Secondary | ICD-10-CM | POA: Diagnosis not present

## 2019-08-12 DIAGNOSIS — J449 Chronic obstructive pulmonary disease, unspecified: Secondary | ICD-10-CM | POA: Diagnosis not present

## 2019-08-12 DIAGNOSIS — I739 Peripheral vascular disease, unspecified: Secondary | ICD-10-CM | POA: Diagnosis not present

## 2019-08-12 DIAGNOSIS — I10 Essential (primary) hypertension: Secondary | ICD-10-CM | POA: Diagnosis not present

## 2019-08-12 DIAGNOSIS — E785 Hyperlipidemia, unspecified: Secondary | ICD-10-CM | POA: Diagnosis not present

## 2019-08-19 ENCOUNTER — Telehealth: Payer: Self-pay | Admitting: *Deleted

## 2019-08-19 NOTE — Telephone Encounter (Signed)
A message was left, re: her follow up visit. 

## 2019-09-18 DIAGNOSIS — F17200 Nicotine dependence, unspecified, uncomplicated: Secondary | ICD-10-CM | POA: Diagnosis not present

## 2019-09-18 DIAGNOSIS — H838X3 Other specified diseases of inner ear, bilateral: Secondary | ICD-10-CM | POA: Diagnosis not present

## 2019-09-18 DIAGNOSIS — H7403 Tympanosclerosis, bilateral: Secondary | ICD-10-CM | POA: Diagnosis not present

## 2019-09-18 DIAGNOSIS — H906 Mixed conductive and sensorineural hearing loss, bilateral: Secondary | ICD-10-CM | POA: Diagnosis not present

## 2019-09-18 DIAGNOSIS — H900 Conductive hearing loss, bilateral: Secondary | ICD-10-CM | POA: Diagnosis not present

## 2019-09-29 DIAGNOSIS — H02055 Trichiasis without entropian left lower eyelid: Secondary | ICD-10-CM | POA: Diagnosis not present

## 2019-10-12 DIAGNOSIS — H2513 Age-related nuclear cataract, bilateral: Secondary | ICD-10-CM | POA: Diagnosis not present

## 2019-10-12 DIAGNOSIS — H524 Presbyopia: Secondary | ICD-10-CM | POA: Diagnosis not present

## 2019-10-12 DIAGNOSIS — H5203 Hypermetropia, bilateral: Secondary | ICD-10-CM | POA: Diagnosis not present

## 2019-10-12 DIAGNOSIS — H40013 Open angle with borderline findings, low risk, bilateral: Secondary | ICD-10-CM | POA: Diagnosis not present

## 2019-12-02 IMAGING — DX DG CHEST 2V
2 series · 2 of 2 positions shown · non-contrast
Comparison: January 14, 2013

CLINICAL DATA: Preoperative chest x-ray for vascular surgery.

EXAM:
CHEST - 2 VIEW

[chest pa]
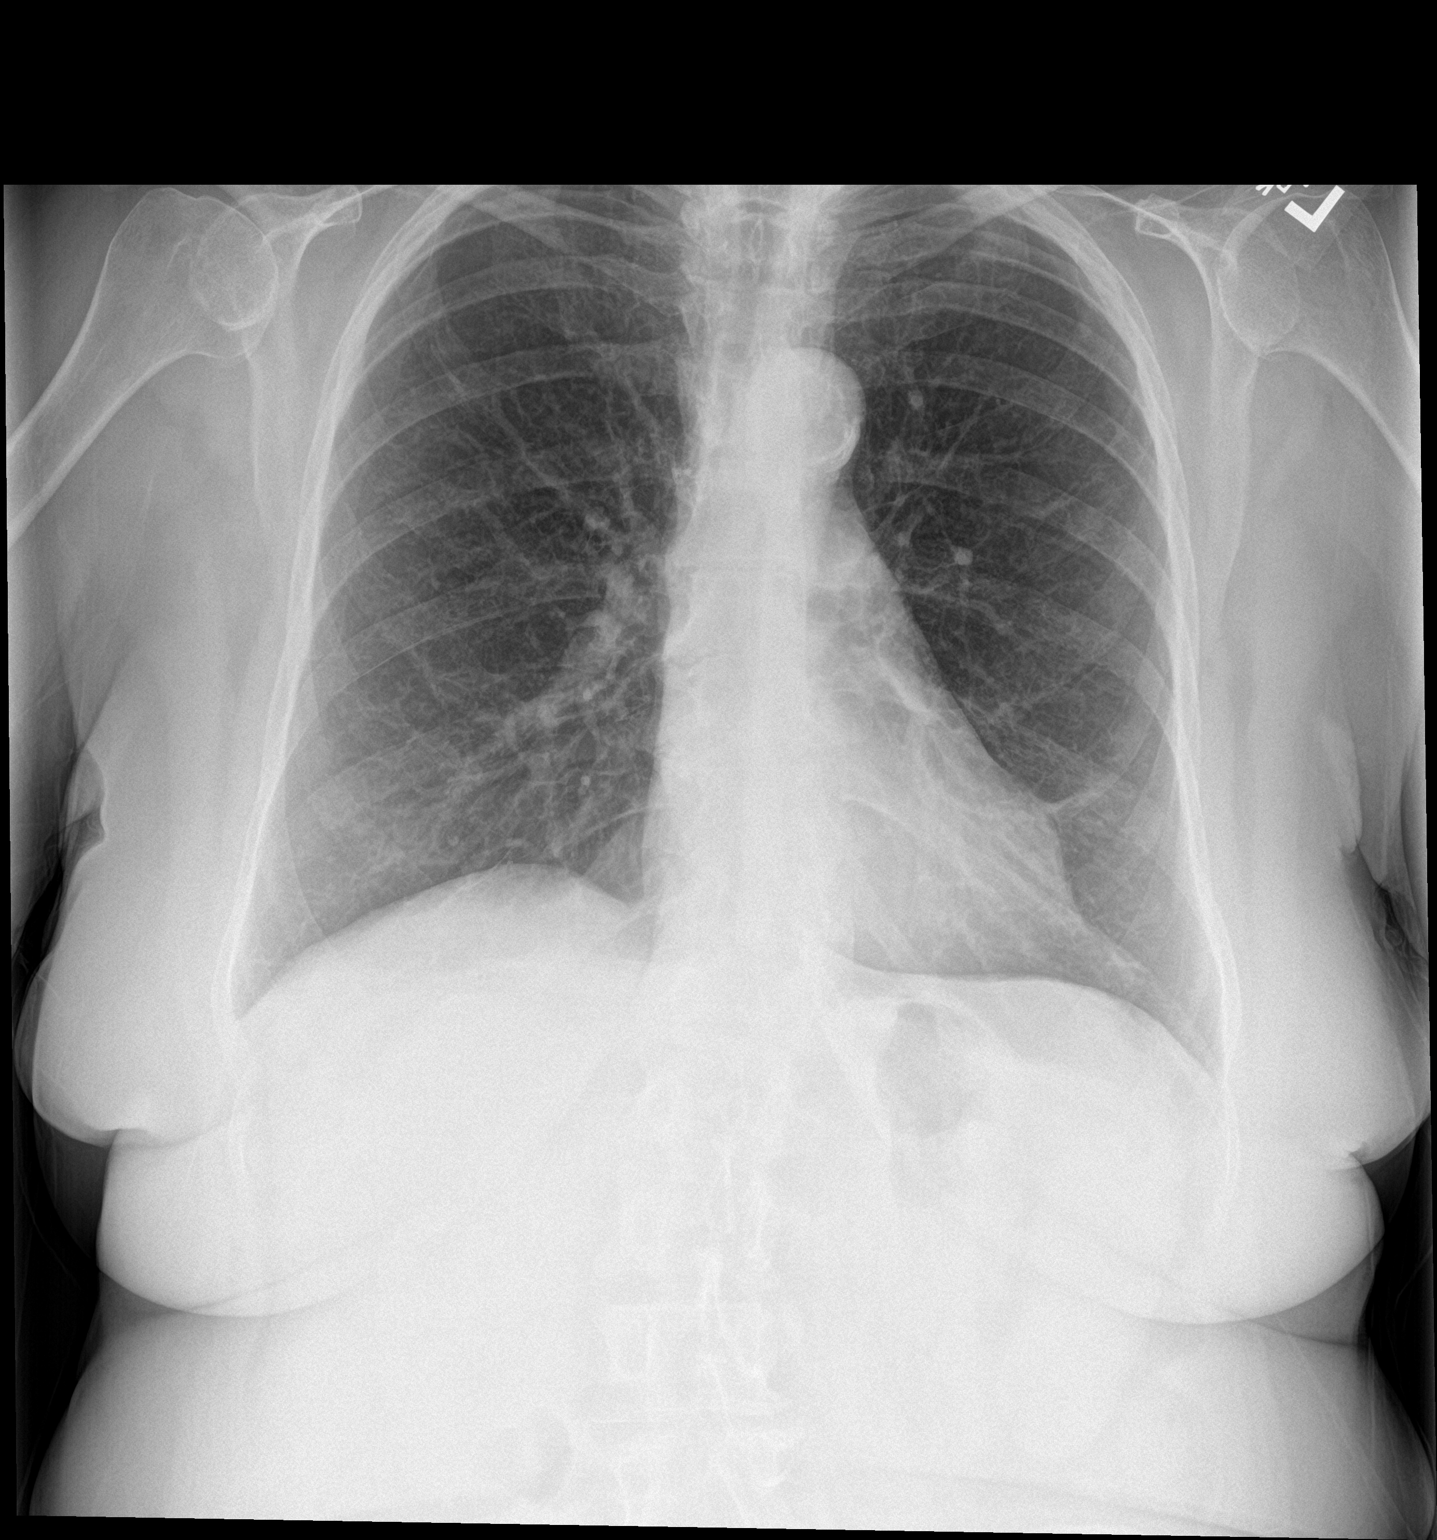

[chest lat]
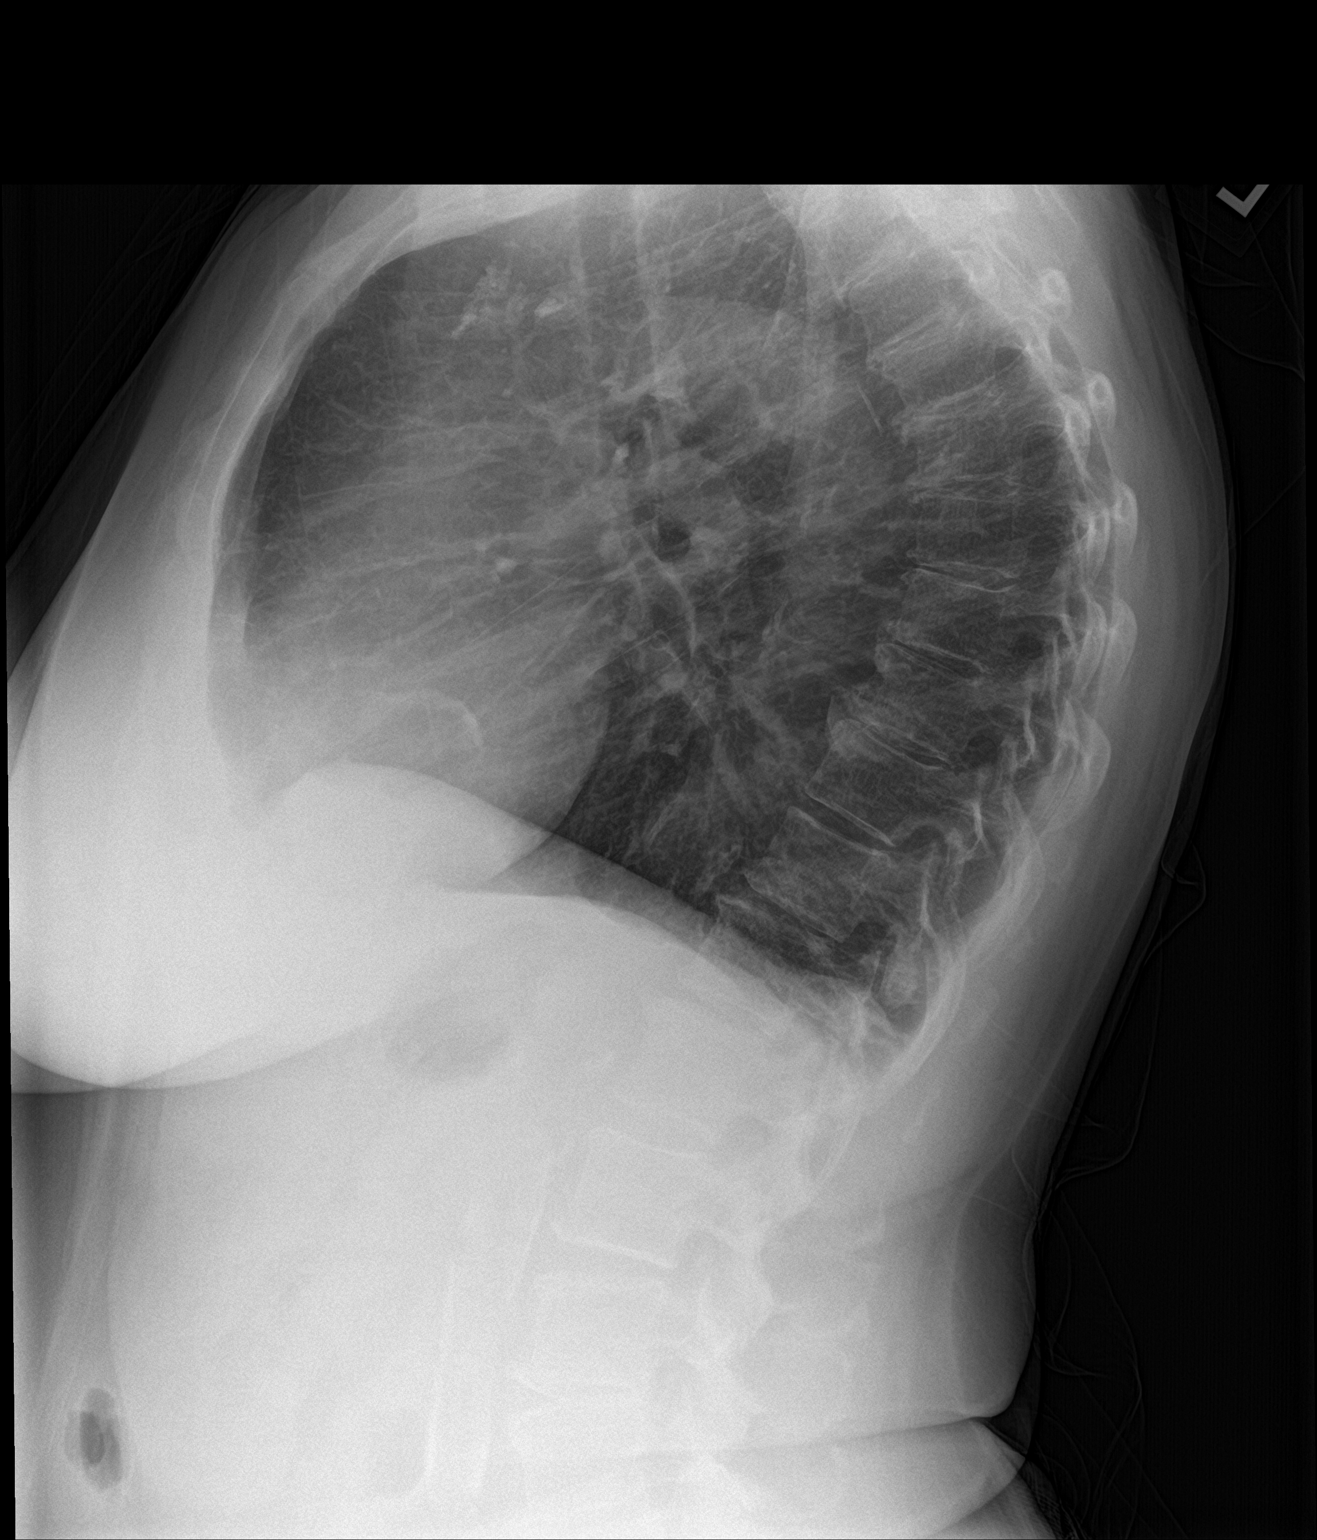

[2 of 2 positions shown; findings below may reference images not displayed]

FINDINGS: The heart size and mediastinal contours are within normal limits.
Both lungs are clear. The visualized skeletal structures are
unremarkable.
IMPRESSION: No active cardiopulmonary disease.

## 2019-12-10 ENCOUNTER — Other Ambulatory Visit: Payer: Self-pay | Admitting: Family Medicine

## 2020-02-17 ENCOUNTER — Other Ambulatory Visit: Payer: Self-pay | Admitting: *Deleted

## 2020-02-17 MED ORDER — BUPROPION HCL ER (XL) 150 MG PO TB24: 150.0000 mg | ORAL_TABLET | Freq: Every day | ORAL | 0 refills | Status: DC

## 2020-02-26 ENCOUNTER — Other Ambulatory Visit: Payer: Self-pay

## 2020-04-20 ENCOUNTER — Other Ambulatory Visit: Payer: Self-pay | Admitting: Family Medicine

## 2020-04-29 ENCOUNTER — Ambulatory Visit: Payer: Medicare Other | Admitting: Podiatry

## 2020-04-29 ENCOUNTER — Ambulatory Visit: Payer: Self-pay | Admitting: Podiatry

## 2020-06-06 ENCOUNTER — Other Ambulatory Visit: Payer: Self-pay | Admitting: Family Medicine

## 2020-08-31 ENCOUNTER — Other Ambulatory Visit: Payer: Self-pay | Admitting: Family Medicine
